# Patient Record
Sex: Male | Born: 1950 | Race: Black or African American | Hispanic: No | Marital: Married | Smoking: Never smoker
Health system: Southern US, Community
[De-identification: ages and names within clinical notes are randomized; demographics above are authoritative.]

## PROBLEM LIST (undated history)

## (undated) DIAGNOSIS — H547 Unspecified visual loss: Secondary | ICD-10-CM

## (undated) DIAGNOSIS — E669 Obesity, unspecified: Secondary | ICD-10-CM

## (undated) DIAGNOSIS — K279 Peptic ulcer, site unspecified, unspecified as acute or chronic, without hemorrhage or perforation: Secondary | ICD-10-CM

## (undated) DIAGNOSIS — E119 Type 2 diabetes mellitus without complications: Secondary | ICD-10-CM

## (undated) DIAGNOSIS — K5900 Constipation, unspecified: Secondary | ICD-10-CM

## (undated) DIAGNOSIS — E7800 Pure hypercholesterolemia, unspecified: Secondary | ICD-10-CM

## (undated) DIAGNOSIS — I10 Essential (primary) hypertension: Secondary | ICD-10-CM

## (undated) HISTORY — PX: EYE SURGERY: SHX253

## (undated) HISTORY — PX: CATARACT EXTRACTION: SUR2

## (undated) HISTORY — PX: NOSE SURGERY: SHX723

## (undated) HISTORY — PX: COLONOSCOPY: SHX174

---

## 2004-08-11 ENCOUNTER — Ambulatory Visit: Payer: Self-pay | Admitting: Unknown Physician Specialty

## 2013-09-18 DIAGNOSIS — I10 Essential (primary) hypertension: Secondary | ICD-10-CM | POA: Diagnosis present

## 2013-09-18 DIAGNOSIS — H547 Unspecified visual loss: Secondary | ICD-10-CM

## 2013-09-18 DIAGNOSIS — E782 Mixed hyperlipidemia: Secondary | ICD-10-CM | POA: Diagnosis present

## 2013-09-18 DIAGNOSIS — E119 Type 2 diabetes mellitus without complications: Secondary | ICD-10-CM

## 2014-01-22 DIAGNOSIS — F840 Autistic disorder: Secondary | ICD-10-CM | POA: Diagnosis present

## 2015-08-18 ENCOUNTER — Encounter: Payer: Self-pay | Admitting: *Deleted

## 2015-08-19 ENCOUNTER — Ambulatory Visit: Payer: Medicare Other | Admitting: Anesthesiology

## 2015-08-19 ENCOUNTER — Encounter: Payer: Self-pay | Admitting: *Deleted

## 2015-08-19 ENCOUNTER — Encounter
Admission: RE | Disposition: A | Discharge status: Home Health | Payer: Self-pay | Source: Ambulatory Visit | Attending: Unknown Physician Specialty

## 2015-08-19 ENCOUNTER — Ambulatory Visit
Admission: RE | Admit: 2015-08-19 | Discharge: 2015-08-19 | Disposition: A | Discharge status: Home Health | Payer: Medicare Other | Source: Ambulatory Visit | Attending: Unknown Physician Specialty | Admitting: Unknown Physician Specialty

## 2015-08-19 DIAGNOSIS — Z1211 Encounter for screening for malignant neoplasm of colon: Secondary | ICD-10-CM | POA: Insufficient documentation

## 2015-08-19 DIAGNOSIS — K640 First degree hemorrhoids: Secondary | ICD-10-CM | POA: Insufficient documentation

## 2015-08-19 DIAGNOSIS — R0602 Shortness of breath: Secondary | ICD-10-CM | POA: Insufficient documentation

## 2015-08-19 DIAGNOSIS — Z8711 Personal history of peptic ulcer disease: Secondary | ICD-10-CM | POA: Diagnosis not present

## 2015-08-19 DIAGNOSIS — E119 Type 2 diabetes mellitus without complications: Secondary | ICD-10-CM | POA: Insufficient documentation

## 2015-08-19 DIAGNOSIS — H540 Blindness, both eyes: Secondary | ICD-10-CM | POA: Insufficient documentation

## 2015-08-19 DIAGNOSIS — I10 Essential (primary) hypertension: Secondary | ICD-10-CM | POA: Diagnosis not present

## 2015-08-19 DIAGNOSIS — Z9842 Cataract extraction status, left eye: Secondary | ICD-10-CM | POA: Insufficient documentation

## 2015-08-19 DIAGNOSIS — Z9841 Cataract extraction status, right eye: Secondary | ICD-10-CM | POA: Insufficient documentation

## 2015-08-19 DIAGNOSIS — Z6828 Body mass index (BMI) 28.0-28.9, adult: Secondary | ICD-10-CM | POA: Diagnosis not present

## 2015-08-19 DIAGNOSIS — E669 Obesity, unspecified: Secondary | ICD-10-CM | POA: Insufficient documentation

## 2015-08-19 DIAGNOSIS — Z79899 Other long term (current) drug therapy: Secondary | ICD-10-CM | POA: Insufficient documentation

## 2015-08-19 DIAGNOSIS — E7800 Pure hypercholesterolemia, unspecified: Secondary | ICD-10-CM | POA: Diagnosis not present

## 2015-08-19 DIAGNOSIS — K5730 Diverticulosis of large intestine without perforation or abscess without bleeding: Secondary | ICD-10-CM | POA: Diagnosis not present

## 2015-08-19 HISTORY — DX: Pure hypercholesterolemia, unspecified: E78.00

## 2015-08-19 HISTORY — DX: Type 2 diabetes mellitus without complications: E11.9

## 2015-08-19 HISTORY — DX: Peptic ulcer, site unspecified, unspecified as acute or chronic, without hemorrhage or perforation: K27.9

## 2015-08-19 HISTORY — PX: COLONOSCOPY WITH PROPOFOL: SHX5780

## 2015-08-19 HISTORY — DX: Obesity, unspecified: E66.9

## 2015-08-19 HISTORY — DX: Unspecified visual loss: H54.7

## 2015-08-19 HISTORY — DX: Constipation, unspecified: K59.00

## 2015-08-19 HISTORY — DX: Essential (primary) hypertension: I10

## 2015-08-19 SURGERY — COLONOSCOPY WITH PROPOFOL
Anesthesia: General

## 2015-08-19 MED ORDER — SODIUM CHLORIDE 0.9 % IV SOLN
INTRAVENOUS | Status: DC
  Administered 2015-08-19: 1000 mL via INTRAVENOUS

## 2015-08-19 MED ORDER — LIDOCAINE HCL (CARDIAC) 20 MG/ML IV SOLN
INTRAVENOUS | Status: DC | PRN
  Administered 2015-08-19: 40 mg via INTRAVENOUS

## 2015-08-19 MED ORDER — PROPOFOL 10 MG/ML IV BOLUS
INTRAVENOUS | Status: DC | PRN
  Administered 2015-08-19: 20 mg via INTRAVENOUS
  Administered 2015-08-19: 30 mg via INTRAVENOUS
  Administered 2015-08-19: 50 mg via INTRAVENOUS

## 2015-08-19 MED ORDER — PROPOFOL 500 MG/50ML IV EMUL
INTRAVENOUS | Status: DC | PRN
Start: 2015-08-19 — End: 2015-08-19
  Administered 2015-08-19: 150 ug/kg/min via INTRAVENOUS

## 2015-08-19 MED ORDER — SODIUM CHLORIDE 0.9 % IV SOLN
INTRAVENOUS | Status: DC
Start: 2015-08-19 — End: 2015-08-19

## 2015-08-19 NOTE — Anesthesia Preprocedure Evaluation (Signed)
Anesthesia Evaluation  Patient identified by MRN, date of birth, ID band Patient awake    Reviewed: Allergy & Precautions, H&P , NPO status , Patient's Chart, lab work & pertinent test results  History of Anesthesia Complications Negative for: history of anesthetic complications  Airway Mallampati: III  TM Distance: >3 FB Neck ROM: limited    Dental  (+) Poor Dentition, Chipped   Pulmonary shortness of breath and with exertion,    Pulmonary exam normal breath sounds clear to auscultation       Cardiovascular Exercise Tolerance: Poor hypertension, (-) angina+ DOE  (-) Past MI Normal cardiovascular exam Rhythm:regular Rate:Normal     Neuro/Psych negative neurological ROS  negative psych ROS   GI/Hepatic Neg liver ROS, PUD, neg GERD  ,  Endo/Other  diabetes, Poorly Controlled, Type 2  Renal/GU negative Renal ROS  negative genitourinary   Musculoskeletal   Abdominal   Peds  Hematology negative hematology ROS (+)   Anesthesia Other Findings Past Medical History:   Blindness                                                    Constipation                                                 Hypertension                                                 Peptic ulcer disease                                         Obesity                                                      Hypercholesteremia                                           Diabetes mellitus without complication (HCC)                Past Surgical History:   NOSE SURGERY                                                  CATARACT EXTRACTION                             Bilateral              EYE SURGERY  COLONOSCOPY                                                  BMI    Body Mass Index   28.49 kg/m 2      Reproductive/Obstetrics negative OB ROS                             Anesthesia  Physical Anesthesia Plan  ASA: III  Anesthesia Plan: General   Post-op Pain Management:    Induction:   Airway Management Planned:   Additional Equipment:   Intra-op Plan:   Post-operative Plan:   Informed Consent: I have reviewed the patients History and Physical, chart, labs and discussed the procedure including the risks, benefits and alternatives for the proposed anesthesia with the patient or authorized representative who has indicated his/her understanding and acceptance.   Dental Advisory Given  Plan Discussed with: Anesthesiologist, CRNA and Surgeon  Anesthesia Plan Comments:         Anesthesia Quick Evaluation

## 2015-08-19 NOTE — Anesthesia Postprocedure Evaluation (Signed)
Anesthesia Post Note  Patient: Keith Obrien  Procedure(s) Performed: Procedure(s) (LRB): COLONOSCOPY WITH PROPOFOL (N/A)  Patient location during evaluation: Endoscopy Anesthesia Type: General Level of consciousness: awake and alert Pain management: pain level controlled Vital Signs Assessment: post-procedure vital signs reviewed and stable Respiratory status: spontaneous breathing, nonlabored ventilation, respiratory function stable and patient connected to nasal cannula oxygen Cardiovascular status: blood pressure returned to baseline and stable Postop Assessment: no signs of nausea or vomiting Anesthetic complications: no    Last Vitals:  Filed Vitals:   08/19/15 1413 08/19/15 1423  BP: 106/88 126/89  Pulse: 74 67  Temp:    Resp: 17 9    Last Pain: There were no vitals filed for this visit.               Cleda MccreedyJoseph K Jermya Dowding

## 2015-08-19 NOTE — Transfer of Care (Signed)
Immediate Anesthesia Transfer of Care Note  Patient: Keith Obrien  Procedure(s) Performed: Procedure(s): COLONOSCOPY WITH PROPOFOL (N/A)  Patient Location: PACU  Anesthesia Type:General  Level of Consciousness: sedated  Airway & Oxygen Therapy: Patient Spontanous Breathing and Patient connected to nasal cannula oxygen  Post-op Assessment: Report given to RN and Post -op Vital signs reviewed and stable  Post vital signs: Reviewed and stable  Last Vitals:  Filed Vitals:   08/19/15 1252  BP: 126/77  Pulse: 70  Temp: 36.8 C  Resp: 20    Last Pain: There were no vitals filed for this visit.       Complications: No apparent anesthesia complications

## 2015-08-19 NOTE — H&P (Signed)
   Primary Care Physician:  Patrice ParadiseMCLAUGHLIN, MIRIAM K, MD Primary Gastroenterologist:  Dr. Mechele CollinElliott  Pre-Procedure History & Physical: HPI:  Keith Obrien is a 65 y.o. male is here for an colonoscopy.   Past Medical History  Diagnosis Date  . Blindness   . Constipation   . Hypertension   . Peptic ulcer disease   . Obesity   . Hypercholesteremia   . Diabetes mellitus without complication [facility](HCC)     Past Surgical History  Procedure Laterality Date  . Nose surgery    . Cataract extraction Bilateral   . Eye surgery    . Colonoscopy      Prior to Admission medications   Medication Sig Start Date End Date Taking? Authorizing Provider  amLODipine (NORVASC) 5 MG tablet Take 5 mg by mouth daily.   Yes Historical Provider, MD  benazepril (LOTENSIN) 10 MG tablet Take 10 mg by mouth daily.   Yes Historical Provider, MD  triamterene-hydrochlorothiazide (MAXZIDE-25) 37.5-25 MG tablet Take 1 tablet by mouth daily.   Yes Historical Provider, MD    Allergies as of 08/03/2015  . (Not on File)    History reviewed. No pertinent family history.  Social History   Social History  . Marital Status: Married    Spouse Name: N/A  . Number of Children: N/A  . Years of Education: N/A   Occupational History  . Not on file.   Social History Main Topics  . Smoking status: Never Smoker   . Smokeless tobacco: Never Used  . Alcohol Use: No  . Drug Use: No  . Sexual Activity: Not on file   Other Topics Concern  . Not on file   Social History Narrative    Review of Systems: See HPI, otherwise negative ROS  Physical Exam: BP 126/89 mmHg  Pulse 67  Temp(Src) 97.3 F (36.3 C) (Axillary)  Resp 9  Ht 5\' 7"  (1.702 m)  Wt 82.555 kg (182 lb)  BMI 28.50 kg/m2  SpO2 100% General:   Alert,  pleasant and cooperative in NAD Head:  Normocephalic and atraumatic. Neck:  Supple; no masses or thyromegaly. Lungs:  Clear throughout to auscultation.    Heart:  Regular rate and rhythm. Abdomen:   Soft, nontender and nondistended. Normal bowel sounds, without guarding, and without rebound.   Neurologic:  Alert and  oriented x4;  grossly normal neurologically.  Impression/Plan: Keith Obrien is here for an colonoscopy to be performed for screening  Risks, benefits, limitations, and alternatives regarding  colonoscopy have been reviewed with the patient.  Questions have been answered.  All parties agreeable.   Lynnae PrudeELLIOTT, ROBERT, MD  08/19/2015, 5:04 PM

## 2015-08-19 NOTE — Anesthesia Procedure Notes (Signed)
Performed by: Debbi Strandberg Pre-anesthesia Checklist: Patient identified, Emergency Drugs available, Patient being monitored, Suction available and Timeout performed Patient Re-evaluated:Patient Re-evaluated prior to inductionOxygen Delivery Method: Nasal cannula Intubation Type: IV induction       

## 2015-08-19 NOTE — Op Note (Signed)
[facility] Gastroenterology Patient Name: Keith Obrien Procedure Date: 08/19/2015 1:16 PM MRN: [id number] Account #: [id number] Date of Birth: 1951/04/03 Admit Type: Outpatient Age: 65 Room: [facility] ENDO ROOM 3 Gender: Male Note Status: Finalized Procedure:            Colonoscopy Indications:          Screening for colorectal malignant neoplasm Providers:            Scot Jun, MD Referring MD:         Marilynne Halsted, MD (Referring MD) Medicines:            Propofol per Anesthesia Complications:        No immediate complications. Procedure:            Pre-Anesthesia Assessment:                       - After reviewing the risks and benefits, the patient                        was deemed in satisfactory condition to undergo the                        procedure.                       After obtaining informed consent, the colonoscope was                        passed under direct vision. Throughout the procedure,                        the patient's blood pressure, pulse, and oxygen                        saturations were monitored continuously. The                        Colonoscope was introduced through the anus and                        advanced to the the cecum, identified by appendiceal                        orifice and ileocecal valve. The colonoscopy was                        somewhat difficult due to significant looping.                        Successful completion of the procedure was aided by                        applying abdominal pressure. The patient tolerated the                        procedure well. The quality of the bowel preparation                        was good. Findings:      Internal hemorrhoids were found during endoscopy. The hemorrhoids were       small  and Grade I (internal hemorrhoids that do not prolapse).      A few small-mouthed diverticula were found in the sigmoid colon.      The exam was otherwise without  abnormality. Impression:           - Internal hemorrhoids.                       - Diverticulosis in the sigmoid colon.                       - The examination was otherwise normal.                       - No specimens collected. Recommendation:       - Repeat colonoscopy in 10 years for screening purposes. Scot Junobert T Husayn Reim, MD 08/19/2015 1:51:53 PM This report has been signed electronically. Number of Addenda: 0 Note Initiated On: 08/19/2015 1:16 PM Scope Withdrawal Time: 0 hours 16 minutes 2 seconds  Total Procedure Duration: 0 hours 28 minutes 46 seconds       [facility]

## 2015-08-22 ENCOUNTER — Encounter: Payer: Self-pay | Admitting: Unknown Physician Specialty

## 2019-12-20 ENCOUNTER — Other Ambulatory Visit
Admission: RE | Admit: 2019-12-20 | Discharge: 2019-12-20 | Disposition: A | Discharge status: Home / Self Care | Payer: Medicare Other | Source: Ambulatory Visit | Attending: Family Medicine | Admitting: Family Medicine

## 2019-12-20 DIAGNOSIS — L03116 Cellulitis of left lower limb: Secondary | ICD-10-CM | POA: Insufficient documentation

## 2019-12-20 DIAGNOSIS — M7989 Other specified soft tissue disorders: Secondary | ICD-10-CM | POA: Diagnosis present

## 2019-12-20 LAB — FIBRIN DERIVATIVES D-DIMER (ARMC ONLY): Fibrin derivatives D-dimer (ARMC): 2930.86 ng/mL (FEU) — ABNORMAL HIGH (ref 0.00–499.00)

## 2019-12-23 ENCOUNTER — Other Ambulatory Visit: Payer: Self-pay

## 2019-12-23 ENCOUNTER — Ambulatory Visit
Admission: RE | Admit: 2019-12-23 | Discharge: 2019-12-23 | Disposition: A | Discharge status: Home / Self Care | Payer: Medicare Other | Source: Ambulatory Visit | Attending: Internal Medicine | Admitting: Internal Medicine

## 2019-12-23 ENCOUNTER — Other Ambulatory Visit: Payer: Self-pay | Admitting: Internal Medicine

## 2019-12-23 ENCOUNTER — Encounter (INDEPENDENT_AMBULATORY_CARE_PROVIDER_SITE_OTHER): Payer: Self-pay

## 2019-12-23 DIAGNOSIS — M79662 Pain in left lower leg: Secondary | ICD-10-CM | POA: Diagnosis present

## 2019-12-23 DIAGNOSIS — M7989 Other specified soft tissue disorders: Secondary | ICD-10-CM | POA: Diagnosis present

## 2021-02-26 IMAGING — US US EXTREM LOW VENOUS*L*
1 series · 13 of 24 positions shown · non-contrast
Comparison: None.

CLINICAL DATA: Left lower leg pain swelling, elevated D-dimer



[Series 1: us extrem low venous*left* · 0.07mm/px · 13 of 32 slices shown]
[im 1/32]
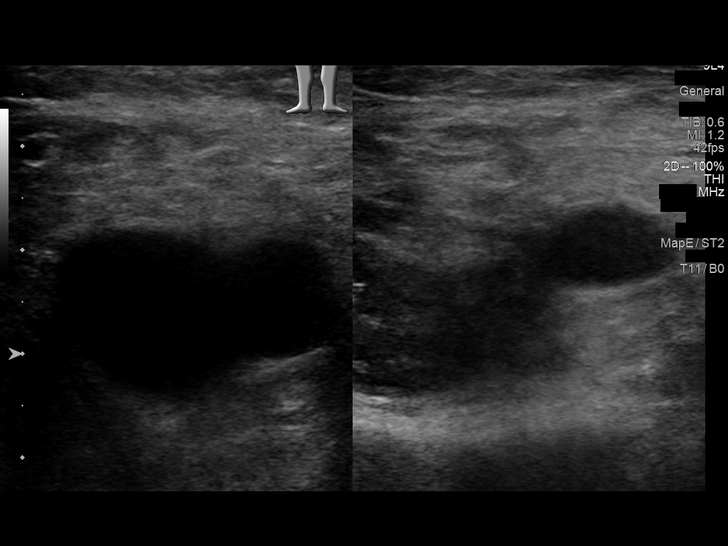
[im 3/32]
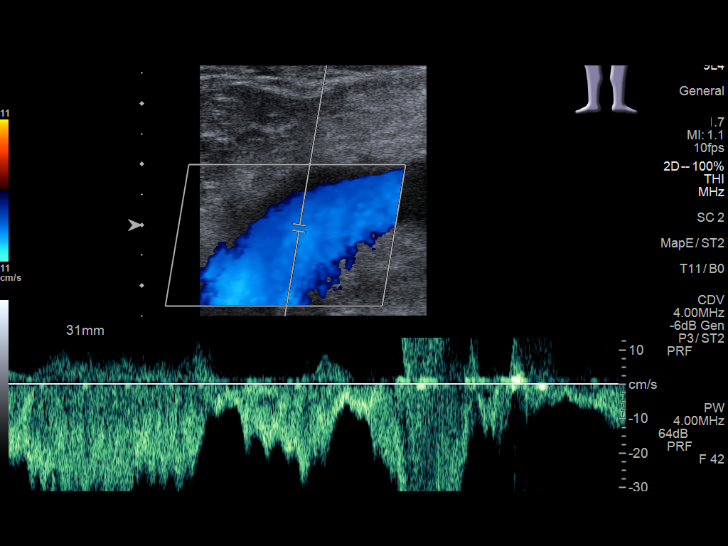
[im 6/32]
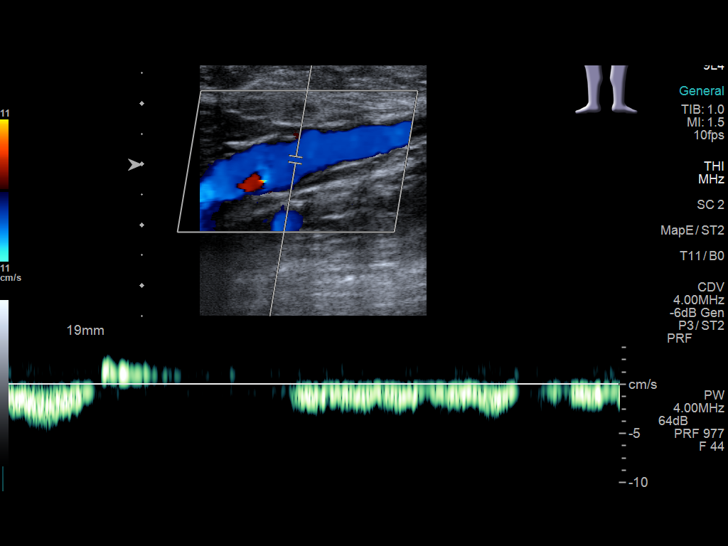
[im 9/32]
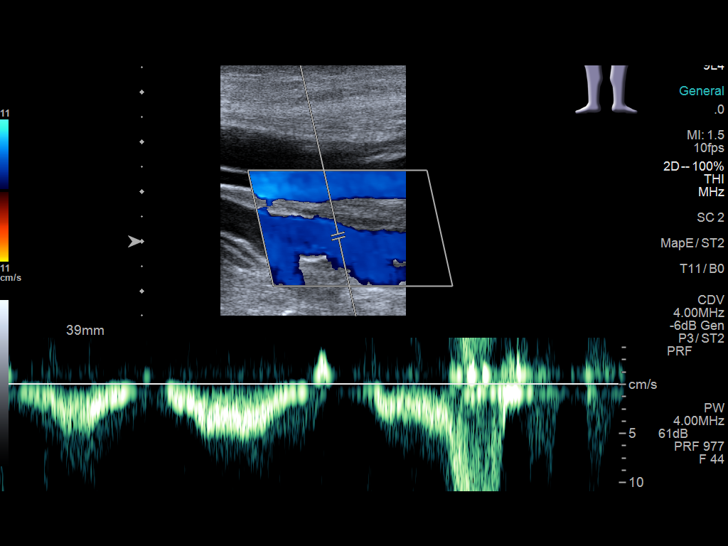
[im 11/32]
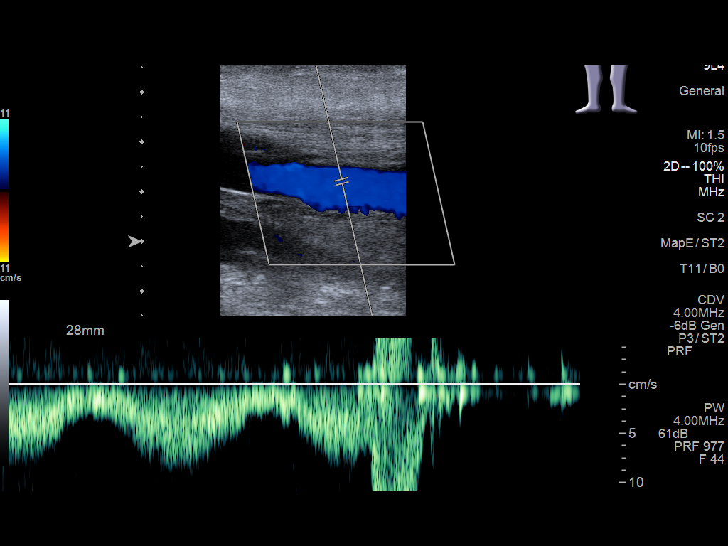
[im 14/32]
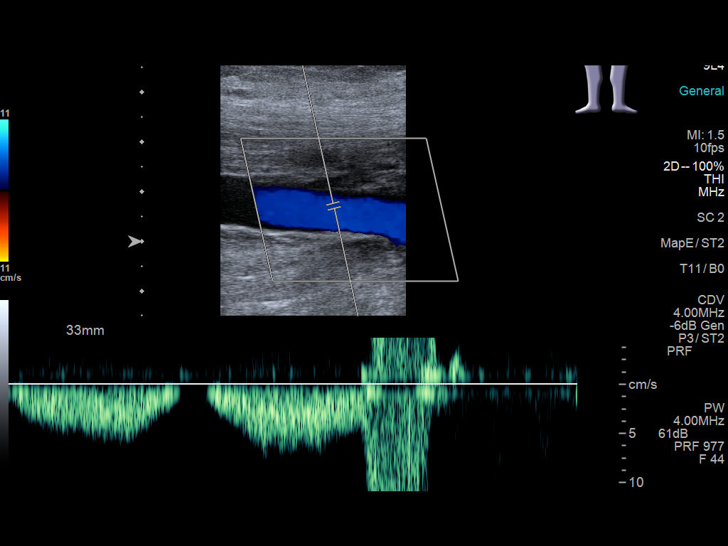
[im 17/32]
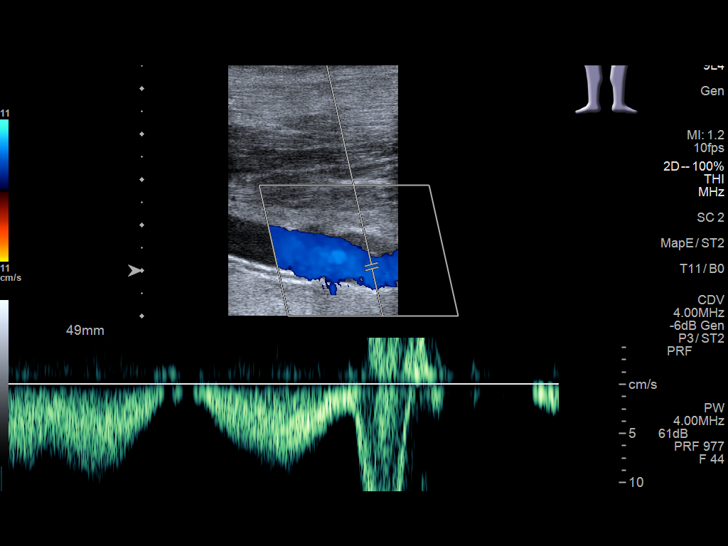
[im 18/32]
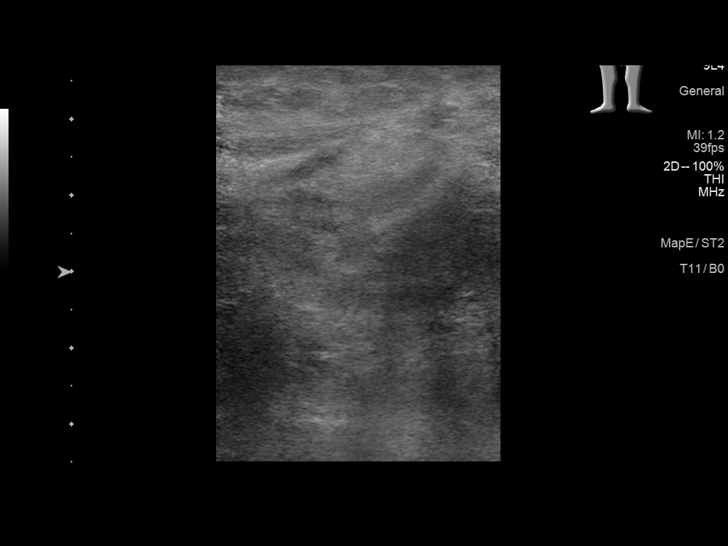
[im 21/32]
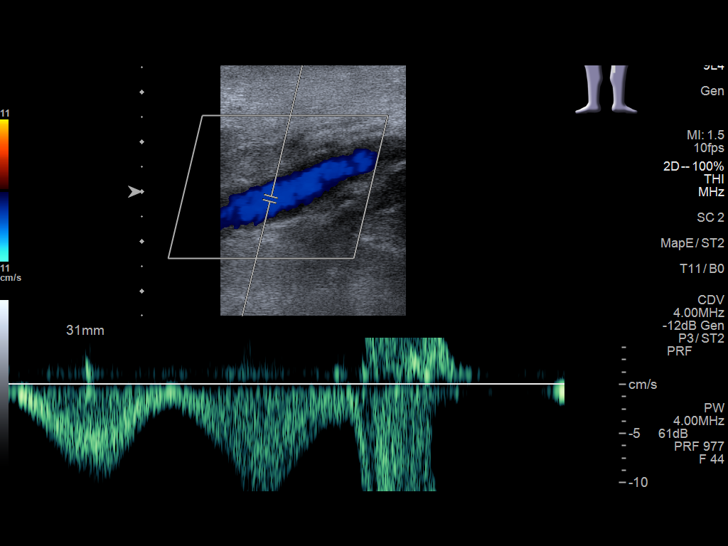
[im 23/32]
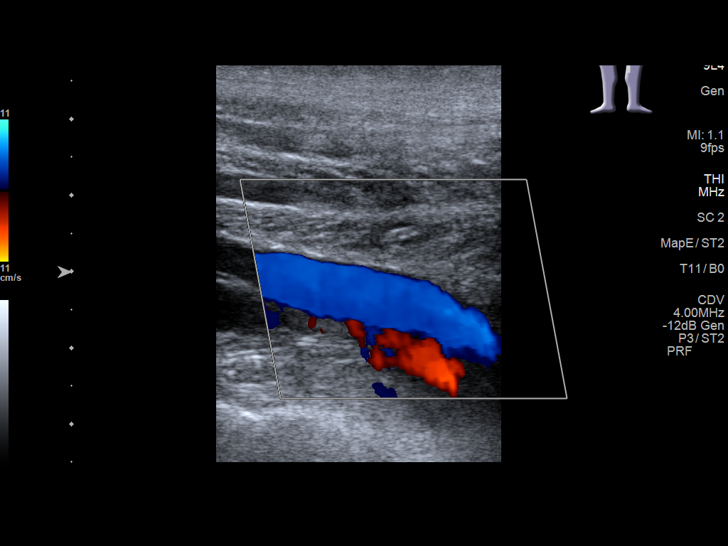
[im 26/32]
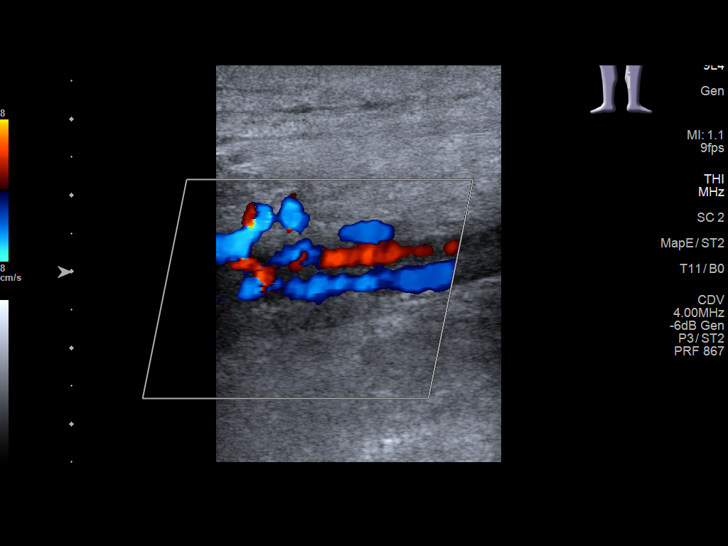
[im 29/32]
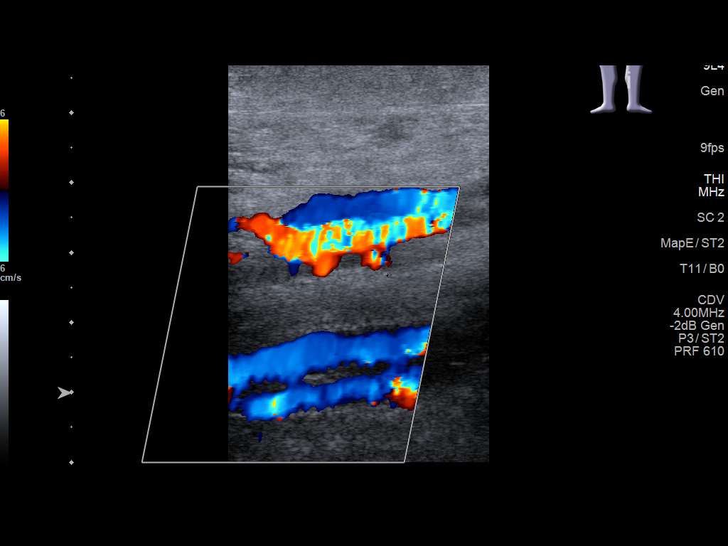
[im 32/32]
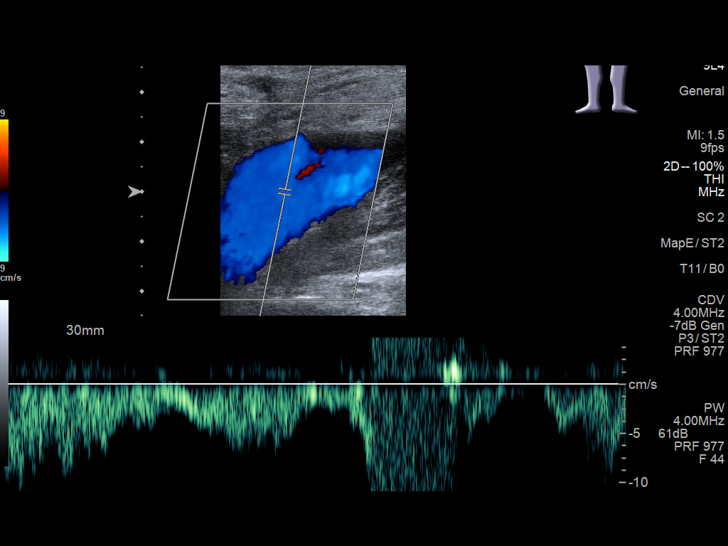

[13 of 24 positions shown; findings below may reference images not displayed]

FINDINGS: Contralateral Common Femoral Vein: Respiratory phasicity is normal
and symmetric with the symptomatic side. No evidence of thrombus.
Normal compressibility.

Common Femoral Vein: No evidence of thrombus. Normal
compressibility, respiratory phasicity and response to augmentation.

Saphenofemoral Junction: No evidence of thrombus. Normal
compressibility and flow on color Doppler imaging.

Profunda Femoral Vein: No evidence of thrombus. Normal
compressibility and flow on color Doppler imaging.

Femoral Vein: No evidence of thrombus. Normal compressibility,
respiratory phasicity and response to augmentation.

Popliteal Vein: No evidence of thrombus. Normal compressibility,
respiratory phasicity and response to augmentation.

Calf Veins: No evidence of thrombus. Normal compressibility and flow
on color Doppler imaging.
IMPRESSION: No evidence of deep venous thrombosis.

## 2022-01-15 ENCOUNTER — Inpatient Hospital Stay
Admission: EM | Admit: 2022-01-15 | Discharge: 2022-01-18 | DRG: 871 | Disposition: A | Discharge status: Skilled Nursing Facility | Payer: Medicare Other | Attending: Internal Medicine | Admitting: Internal Medicine

## 2022-01-15 ENCOUNTER — Other Ambulatory Visit: Payer: Self-pay

## 2022-01-15 ENCOUNTER — Emergency Department: Payer: Medicare Other

## 2022-01-15 DIAGNOSIS — N281 Cyst of kidney, acquired: Secondary | ICD-10-CM | POA: Diagnosis present

## 2022-01-15 DIAGNOSIS — H548 Legal blindness, as defined in USA: Secondary | ICD-10-CM | POA: Diagnosis present

## 2022-01-15 DIAGNOSIS — R7989 Other specified abnormal findings of blood chemistry: Secondary | ICD-10-CM | POA: Diagnosis not present

## 2022-01-15 DIAGNOSIS — L0390 Cellulitis, unspecified: Secondary | ICD-10-CM

## 2022-01-15 DIAGNOSIS — R32 Unspecified urinary incontinence: Secondary | ICD-10-CM | POA: Diagnosis present

## 2022-01-15 DIAGNOSIS — Z9841 Cataract extraction status, right eye: Secondary | ICD-10-CM

## 2022-01-15 DIAGNOSIS — H5440 Blindness, one eye, unspecified eye: Secondary | ICD-10-CM

## 2022-01-15 DIAGNOSIS — B958 Unspecified staphylococcus as the cause of diseases classified elsewhere: Secondary | ICD-10-CM | POA: Diagnosis present

## 2022-01-15 DIAGNOSIS — E119 Type 2 diabetes mellitus without complications: Secondary | ICD-10-CM | POA: Diagnosis not present

## 2022-01-15 DIAGNOSIS — E1165 Type 2 diabetes mellitus with hyperglycemia: Secondary | ICD-10-CM | POA: Diagnosis present

## 2022-01-15 DIAGNOSIS — R410 Disorientation, unspecified: Principal | ICD-10-CM

## 2022-01-15 DIAGNOSIS — G9341 Metabolic encephalopathy: Secondary | ICD-10-CM | POA: Diagnosis present

## 2022-01-15 DIAGNOSIS — L03116 Cellulitis of left lower limb: Secondary | ICD-10-CM | POA: Diagnosis present

## 2022-01-15 DIAGNOSIS — J9811 Atelectasis: Secondary | ICD-10-CM | POA: Diagnosis present

## 2022-01-15 DIAGNOSIS — N200 Calculus of kidney: Secondary | ICD-10-CM | POA: Diagnosis present

## 2022-01-15 DIAGNOSIS — E782 Mixed hyperlipidemia: Secondary | ICD-10-CM | POA: Diagnosis present

## 2022-01-15 DIAGNOSIS — Z7984 Long term (current) use of oral hypoglycemic drugs: Secondary | ICD-10-CM | POA: Diagnosis not present

## 2022-01-15 DIAGNOSIS — Z79899 Other long term (current) drug therapy: Secondary | ICD-10-CM

## 2022-01-15 DIAGNOSIS — K449 Diaphragmatic hernia without obstruction or gangrene: Secondary | ICD-10-CM | POA: Diagnosis present

## 2022-01-15 DIAGNOSIS — I10 Essential (primary) hypertension: Secondary | ICD-10-CM

## 2022-01-15 DIAGNOSIS — Z9842 Cataract extraction status, left eye: Secondary | ICD-10-CM

## 2022-01-15 DIAGNOSIS — H547 Unspecified visual loss: Secondary | ICD-10-CM

## 2022-01-15 DIAGNOSIS — N179 Acute kidney failure, unspecified: Secondary | ICD-10-CM | POA: Diagnosis present

## 2022-01-15 DIAGNOSIS — A419 Sepsis, unspecified organism: Principal | ICD-10-CM

## 2022-01-15 DIAGNOSIS — Z20822 Contact with and (suspected) exposure to covid-19: Secondary | ICD-10-CM | POA: Diagnosis present

## 2022-01-15 DIAGNOSIS — R6520 Severe sepsis without septic shock: Secondary | ICD-10-CM | POA: Diagnosis present

## 2022-01-15 DIAGNOSIS — D72829 Elevated white blood cell count, unspecified: Secondary | ICD-10-CM

## 2022-01-15 DIAGNOSIS — F840 Autistic disorder: Secondary | ICD-10-CM | POA: Diagnosis present

## 2022-01-15 LAB — COMPREHENSIVE METABOLIC PANEL
ALT: 20 U/L (ref 0–44)
AST: 35 U/L (ref 15–41)
Albumin: 4.4 g/dL (ref 3.5–5.0)
Alkaline Phosphatase: 75 U/L (ref 38–126)
Anion gap: 13 (ref 5–15)
BUN: 26 mg/dL — ABNORMAL HIGH (ref 8–23)
CO2: 22 mmol/L (ref 22–32)
Calcium: 9.6 mg/dL (ref 8.9–10.3)
Chloride: 104 mmol/L (ref 98–111)
Creatinine, Ser: 1.78 mg/dL — ABNORMAL HIGH (ref 0.61–1.24)
GFR, Estimated: 40 mL/min — ABNORMAL LOW (ref >60)
Glucose, Bld: 166 mg/dL — ABNORMAL HIGH (ref 70–99)
Potassium: 3.7 mmol/L (ref 3.5–5.1)
Sodium: 139 mmol/L (ref 135–145)
Total Bilirubin: 0.8 mg/dL (ref 0.3–1.2)
Total Protein: 8.2 g/dL — ABNORMAL HIGH (ref 6.5–8.1)

## 2022-01-15 LAB — SARS CORONAVIRUS 2 BY RT PCR: SARS Coronavirus 2 by RT PCR: NEGATIVE

## 2022-01-15 LAB — CBC WITH DIFFERENTIAL/PLATELET
Abs Immature Granulocytes: 0.1 10*3/uL — ABNORMAL HIGH (ref 0.00–0.07)
Basophils Absolute: 0 10*3/uL (ref 0.0–0.1)
Basophils Relative: 0 %
Eosinophils Absolute: 0 10*3/uL (ref 0.0–0.5)
Eosinophils Relative: 0 %
HCT: 45.3 % (ref 39.0–52.0)
Hemoglobin: 14 g/dL (ref 13.0–17.0)
Immature Granulocytes: 1 %
Lymphocytes Relative: 6 %
Lymphs Abs: 0.9 10*3/uL (ref 0.7–4.0)
MCH: 27.6 pg (ref 26.0–34.0)
MCHC: 30.9 g/dL (ref 30.0–36.0)
MCV: 89.3 fL (ref 80.0–100.0)
Monocytes Absolute: 0.6 10*3/uL (ref 0.1–1.0)
Monocytes Relative: 4 %
Neutro Abs: 14.4 10*3/uL — ABNORMAL HIGH (ref 1.7–7.7)
Neutrophils Relative %: 89 %
Platelets: 209 10*3/uL (ref 150–400)
RBC: 5.07 MIL/uL (ref 4.22–5.81)
RDW: 14.8 % (ref 11.5–15.5)
WBC: 16 10*3/uL — ABNORMAL HIGH (ref 4.0–10.5)
nRBC: 0 % (ref 0.0–0.2)

## 2022-01-15 LAB — URINALYSIS, ROUTINE W REFLEX MICROSCOPIC
Bilirubin Urine: NEGATIVE
Glucose, UA: >=500 mg/dL — AB
Hgb urine dipstick: NEGATIVE
Ketones, ur: NEGATIVE mg/dL
Leukocytes,Ua: NEGATIVE
Nitrite: NEGATIVE
Protein, ur: NEGATIVE mg/dL
Specific Gravity, Urine: 1.021 (ref 1.005–1.030)
pH: 5 (ref 5.0–8.0)

## 2022-01-15 LAB — PROTIME-INR
INR: 1.2 (ref 0.8–1.2)
Prothrombin Time: 14.7 seconds (ref 11.4–15.2)

## 2022-01-15 LAB — LACTIC ACID, PLASMA
Lactic Acid, Venous: 3.8 mmol/L (ref 0.5–1.9)
Lactic Acid, Venous: 1.7 mmol/L (ref 0.5–1.9)
Lactic Acid, Venous: 1.2 mmol/L (ref 0.5–1.9)

## 2022-01-15 MED ORDER — VANCOMYCIN HCL IN DEXTROSE 1-5 GM/200ML-% IV SOLN
1000.0000 mg | INTRAVENOUS | Status: DC
  Administered 2022-01-16: 1000 mg via INTRAVENOUS
  Filled 2022-01-15: qty 200

## 2022-01-15 MED ORDER — SODIUM CHLORIDE 0.9 % IV SOLN
2.0000 g | Freq: Two times a day (BID) | INTRAVENOUS | Status: DC
  Administered 2022-01-16: 2 g via INTRAVENOUS
  Filled 2022-01-15: qty 12.5

## 2022-01-15 MED ORDER — IOHEXOL 300 MG/ML  SOLN
80.0000 mL | Freq: Once | INTRAMUSCULAR | Status: AC | PRN
  Administered 2022-01-15: 80 mL via INTRAVENOUS

## 2022-01-15 MED ORDER — SODIUM CHLORIDE 0.9 % IV SOLN
2.0000 g | Freq: Once | INTRAVENOUS | Status: AC
  Administered 2022-01-15: 2 g via INTRAVENOUS
  Filled 2022-01-15: qty 12.5

## 2022-01-15 MED ORDER — INSULIN ASPART 100 UNIT/ML IJ SOLN
0.0000 [IU] | Freq: Three times a day (TID) | INTRAMUSCULAR | Status: DC
  Administered 2022-01-16 – 2022-01-17 (×4): 2 [IU] via SUBCUTANEOUS
  Administered 2022-01-17: 3 [IU] via SUBCUTANEOUS
  Administered 2022-01-17: 2 [IU] via SUBCUTANEOUS
  Administered 2022-01-18: 3 [IU] via SUBCUTANEOUS
  Filled 2022-01-15 (×7): qty 1

## 2022-01-15 MED ORDER — VANCOMYCIN HCL IN DEXTROSE 1-5 GM/200ML-% IV SOLN
1000.0000 mg | Freq: Once | INTRAVENOUS | Status: AC
  Administered 2022-01-15: 1000 mg via INTRAVENOUS
  Filled 2022-01-15: qty 200

## 2022-01-15 MED ORDER — ACETAMINOPHEN 325 MG PO TABS
650.0000 mg | ORAL_TABLET | Freq: Four times a day (QID) | ORAL | Status: DC | PRN
  Administered 2022-01-15: 650 mg via ORAL
  Filled 2022-01-15: qty 2

## 2022-01-15 MED ORDER — LACTATED RINGERS IV BOLUS
1000.0000 mL | Freq: Once | INTRAVENOUS | Status: AC
  Administered 2022-01-15: 1000 mL via INTRAVENOUS

## 2022-01-15 MED ORDER — INSULIN ASPART 100 UNIT/ML IJ SOLN
0.0000 [IU] | Freq: Every day | INTRAMUSCULAR | Status: DC
  Filled 2022-01-15: qty 1

## 2022-01-15 NOTE — Progress Notes (Signed)
Pharmacy Antibiotic Note  Keith Obrien is a 71 y.o. male admitted on 01/15/2022 with unknown source.  Pharmacy has been consulted for Cefepime & Vancomycin dosing x 7 days.  Plan: Cefepime 2 gm q12hr per indication & renal fxn.   Pt given Vancomycin 1000 mg once. Vancomycin 1000 mg IV Q 24 hrs. Goal AUC 400-550. Expected AUC: 441.5 SCr used: 1.78 Pharmacy will continue to follow and will adjust abx dosing whenever warranted.  Temp (24hrs), Avg:99.5 F (37.5 C), Min:98.2 F (36.8 C), Max:101.7 F (38.7 C)   Recent Labs  Lab 01/15/22 1816 01/15/22 1821 01/15/22 2021 01/15/22 2241  WBC 16.0*  --   --   --   CREATININE 1.78*  --   --   --   LATICACIDVEN  --  3.8* 1.7 1.2    Estimated Creatinine Clearance: 40.6 mL/min (A) (by C-G formula based on SCr of 1.78 mg/dL (H)).    No Known Allergies  Antimicrobials this admission: 10/7 Cefepime >> x 7 days 10/7 Vancomycin >> x 7 days  Microbiology results: 10/7 BCx: Pending  Thank you for allowing pharmacy to be a part of this patient's care.  Renda Rolls, PharmD, MBA 01/15/2022 11:15 PM

## 2022-01-15 NOTE — ED Notes (Signed)
Lab at bedside drawing 2nd set of [facility]

## 2022-01-15 NOTE — ED Provider Notes (Signed)
[facility] Provider Note    Event Date/Time   First MD Initiated Contact with Patient 01/15/22 1845     (approximate)   History   AMS  HPI  Keith Obrien is a 71 y.o. male  who presents from living facility today because of concern for confusion, high blood sugar and urinary incontinence. Patient is unable to give significant history but does state that he urinated on himself today. Did say he had some discomfort to his left abdomen. The patient was found to have fever and high blood sugar at the facility.       Physical Exam   Triage Vital Signs: ED Triage Vitals  Enc Vitals Group     BP 01/15/22 1811 (!) 152/88     Pulse Rate 01/15/22 1811 (!) 115     Resp 01/15/22 1811 (!) 28     Temp 01/15/22 1811 98.5 F (36.9 C)     Temp Source 01/15/22 1811 Oral     SpO2 01/15/22 1811 100 %     Weight 01/15/22 1812 209 lb 10.5 oz (95.1 kg)     Height --      Head Circumference --      Peak Flow --      Pain Score 01/15/22 1812 0     Pain Loc --      Pain Edu? --      Excl. in [place]? --     Most recent vital signs: Vitals:   01/15/22 1811  BP: (!) 152/88  Pulse: (!) 115  Resp: (!) 28  Temp: 98.5 F (36.9 C)  SpO2: 100%   General: Awake, alert, not completely oriented. CV:  Good peripheral perfusion. Tachycardia Resp:  Normal effort. Lungs clear. Abd:  No distention. Non tender. Other:  Area of redness and warmth to the left lower leg.   ED Results / Procedures / Treatments   Labs (all labs ordered are listed, but only abnormal results are displayed) Labs Reviewed  COMPREHENSIVE METABOLIC PANEL - Abnormal; Notable for the following components:      Result Value   Glucose, Bld 166 (*)    BUN 26 (*)    Creatinine, Ser 1.78 (*)    Total Protein 8.2 (*)    GFR, Estimated 40 (*)    All other components within normal limits  LACTIC ACID, PLASMA - Abnormal; Notable for the following components:   Lactic Acid, Venous 3.8 (*)    All other  components within normal limits  CBC WITH DIFFERENTIAL/PLATELET - Abnormal; Notable for the following components:   WBC 16.0 (*)    Neutro Abs 14.4 (*)    Abs Immature Granulocytes 0.10 (*)    All other components within normal limits  URINALYSIS, ROUTINE W REFLEX MICROSCOPIC - Abnormal; Notable for the following components:   Color, Urine YELLOW (*)    APPearance HAZY (*)    Glucose, UA >=500 (*)    Bacteria, UA RARE (*)    All other components within normal limits  CULTURE, BLOOD (ROUTINE X 2)  CULTURE, BLOOD (ROUTINE X 2)  SARS CORONAVIRUS 2 BY RT PCR  LACTIC ACID, PLASMA  PROTIME-INR     EKG  I, Nance Pear, attending physician, personally viewed and interpreted this EKG  EKG Time: 1810 Rate: 119 Rhythm: sinus tachycardia Axis: normal Intervals: qtc 639 QRS: narrow, q waves v1, v2, v3 ST changes: no st elevation Impression: abnormal ekg   RADIOLOGY I independently interpreted and visualized the CXR.  My interpretation: No pneumonia Radiology interpretation:  IMPRESSION:  Linear densities in right lower lung fields may suggest subsegmental  atelectasis. There are no signs of pulmonary edema or focal  pulmonary consolidation.    I independently interpreted and visualized the ct abd/pel. My interpretation: No free air Radiology interpretation:  IMPRESSION:  1. Moderate to large hiatal hernia.  2. Multiple bilateral simple renal cysts. No follow-up imaging is  recommended. This recommendation follows ACR consensus guidelines:  Management of the Incidental Renal Mass on CT: A White Paper of the  ACR Incidental Findings Committee. J Am Coll Radiol 2018;15:264-273.  3. 3 mm nonobstructing right renal calculus.  4. Enlarged and heterogeneous prostate gland. Correlation with PSA  values is recommended.  5. Marked severity multilevel degenerative changes throughout the  lumbar spine.           PROCEDURES:  Critical Care performed:  No  Procedures   MEDICATIONS ORDERED IN ED: Medications - No data to display   IMPRESSION / MDM / ASSESSMENT AND PLAN / ED COURSE  I reviewed the triage vital signs and the nursing notes.                              Differential diagnosis includes, but is not limited to, infection, DKA, anemia.  Patient's presentation is most consistent with acute presentation with potential threat to life or bodily function.  Patient presented to the emergency today because of concerns for confusion, high blood sugar and fevers.  Patient was afebrile here.  Work-up however is concerning for elevated white count and initially elevated lactic acid level.  On physical exam he does have area of possible cellulitis of his left lower extremity.  Broad work-up was initiated for source of other infection.  This included chest x-ray as well as CT abdomen pelvis.  Neither showed obvious infectious source.  UA not convincing for infection.  Patient was given broad-spectrum antibiotics and IV fluids.  Repeat lactic acid had improved. Discussed with Dr. Mikeal Hawthorne with the hospitalist service who will plan on admission.  FINAL CLINICAL IMPRESSION(S) / ED DIAGNOSES   Final diagnoses:  Confusion  Leukocytosis, unspecified type  Elevated lactic acid level  Cellulitis, unspecified cellulitis site     Note:  This document was prepared using Dragon voice recognition software and may include unintentional dictation errors.    Phineas Semen, MD 01/15/22 2200

## 2022-01-15 NOTE — ED Triage Notes (Addendum)
Pt. To ED via EMS from [facility] for AMS and confusion with fever, and hyperglycemia. Per EMS, pt's temp 100.7 en route. Pt. Is legally blind and high functioning autistic.pt. is conversational in triage, and alert and oriented x3. Pt. Reports he has had urinary incontinence today, unsure if this is his norm. Pt. Has area of redness and warm to lower left leg.

## 2022-01-15 NOTE — H&P (Signed)
History and Physical    Patient: Keith Obrien ZWC:[id number] DOB: Dec 14, 1950 DOA: 01/15/2022 DOS: the patient was seen and examined on 01/15/2022 PCP: Marinda Elk, MD  Patient coming from: Home  Chief Complaint:  Chief Complaint  Patient presents with   Altered Mental Status    Pt. To ED via EMS from [facility] for AMS and confusion with fever, and hyperglycemia. Per EMS, Pt. Is legally blind and high functioning autistic.pt. is conversational in triage, and alert and oriented.   HPI: Keith Obrien is a 71 y.o. male with medical history significant of complete blindness, diabetes, constipation, hyperlipidemia, essential hypertension who was brought in from [facility] independent living facility with altered mental status, fever and hyperglycemia.  Patient's initial temperature was apparently 100.7 by EMS but more than 101.2 rectally here.  He is autistic but legally blind.  Patient apparently had urinary incontinence all day.  He arrived the ER where he was noted to meet criteria for sepsis.  Source was unclear.  There is a small area of redness and warmth in the left lower leg but not sure if that is the source.  His urinalysis is nonrevealing.  His lactic acid level was 3.8.  Also leukocytosis of 16 as well as AKI with creatinine 1.78.  As a result patient is being admitted with a diagnosis of sepsis of unknown source with altered mental status.  Review of Systems: As mentioned in the history of present illness. All other systems reviewed and are negative. Past Medical History:  Diagnosis Date   Blindness    Constipation    Diabetes mellitus without complication ([place])    Hypercholesteremia    Hypertension    Obesity    Peptic ulcer disease    Past Surgical History:  Procedure Laterality Date   CATARACT EXTRACTION Bilateral    COLONOSCOPY     COLONOSCOPY WITH PROPOFOL N/A 08/19/2015   Procedure: COLONOSCOPY WITH PROPOFOL;  Surgeon: Manya Silvas, MD;   Location: [facility] ENDOSCOPY;  Service: Endoscopy;  Laterality: N/A;   EYE SURGERY     NOSE SURGERY     Social History:  reports that he has never smoked. He has never used smokeless tobacco. He reports that he does not drink alcohol and does not use drugs.  No Known Allergies  No family history on file.  Prior to Admission medications   Medication Sig Start Date End Date Taking? Authorizing Provider  amLODipine (NORVASC) 5 MG tablet Take 5 mg by mouth daily.   Yes [provider]  benazepril (LOTENSIN) 10 MG tablet Take 10 mg by mouth daily.   Yes [provider]  furosemide (LASIX) 20 MG tablet Take 20 mg by mouth daily. 11/22/21  Yes [provider]  metFORMIN (GLUCOPHAGE) 1000 MG tablet Take 1,000 mg by mouth daily. 11/06/21  Yes [provider]  omeprazole (PRILOSEC) 40 MG capsule Take 40 mg by mouth daily. 11/06/21  Yes [provider]  potassium chloride (KLOR-CON) 10 MEQ tablet Take 10 mEq by mouth daily. 11/18/21  Yes [provider]  risperiDONE (RISPERDAL) 0.5 MG tablet Take 0.5 mg by mouth at bedtime. 12/21/21  Yes [provider]  triamterene-hydrochlorothiazide (MAXZIDE-25) 37.5-25 MG tablet Take 1 tablet by mouth daily.   Yes [provider]  silver sulfADIAZINE (SILVADENE) 1 % cream Apply 1 Application topically daily as needed. 10/21/21   [provider]    Physical Exam: Vitals:   01/15/22 2025 01/15/22 2030 01/15/22 2235 01/15/22 2248  BP:   128/82   Pulse:  (!) 115 (!) 108   Resp:  (!) 32 (!) 25   Temp: 98.2 F (36.8 C)   (!) 101.7 F (38.7 C)  TempSrc: Oral   Rectal  SpO2:  96% 96%   Weight:       Constitutional: Acutely ill looking, confused NAD, calm, comfortable Eyes: PERRL, lids and conjunctivae normal.  Legally blind ENMT: Mucous membranes are moist. Posterior pharynx clear of any exudate or lesions.Normal dentition.  Neck: normal, supple, no masses, no thyromegaly Respiratory:  clear to auscultation bilaterally, no wheezing, no crackles. Normal respiratory effort. No accessory muscle use.  Cardiovascular: Sinus tachycardia, no murmurs / rubs / gallops. No extremity edema. 2+ pedal pulses. No carotid bruits.  Abdomen: no tenderness, no masses palpated. No hepatosplenomegaly. Bowel sounds positive.  Musculoskeletal: Good range of motion, no joint swelling or tenderness, Skin: no rashes, lesions, ulcers. No induration Neurologic: CN 2-12 grossly intact. Sensation intact, DTR normal. Strength 5/5 in all 4.  Psychiatric: Conversational but confused   data Reviewed:  BUN is 26 creatinine 1.78.  Glucose 166.  Lactic acid 3.8, white count 16 and normal CBC.  Analysis nonrevealing.  CT abdomen pelvis showed moderate large hiatal hernia with multiple bilateral simple renal cysts.  Assessment and Plan:  #1 sepsis syndrome: Patient meets sepsis criteria with temperature of 101.5 and sinus tachycardia.  Lactic acid is 3.8.  This could be coming from his left leg early cellulitis or other unknown causes.  He did have some abdominal pain but CT is not revealing.  We will admit the patient for sepsis work-up.  Initiate antibiotics for sepsis from unknown cause.  #2 diabetes: Initiate sliding scale insulin.  Continue monitoring blood sugar.  #3 essential hypertension: Continue home regimen once confirmed.  #4 mixed hyperlipidemia: Continue statin  #5 autism disorder: Patient is highly functional.  Continue to monitor  #6 legal blindness: We will take this into account in patient's management.    Advance Care Planning:   Code Status: Not on file full code  Consults: None  Family Communication: No family at bedside  Severity of Illness: The appropriate patient status for this patient is INPATIENT. Inpatient status is judged to be reasonable and necessary in order to provide the required intensity of service to ensure the patient's safety. The patient's presenting symptoms,  physical exam findings, and initial radiographic and laboratory data in the context of their chronic comorbidities is felt to place them at high risk for further clinical deterioration. Furthermore, it is not anticipated that the patient will be medically stable for discharge from the hospital within 2 midnights of admission.   * I certify that at the point of admission it is my clinical judgment that the patient will require inpatient hospital care spanning beyond 2 midnights from the point of admission due to high intensity of service, high risk for further deterioration and high frequency of surveillance required.*  AuthorLonia Blood, MD 01/15/2022 11:10 PM  For on call review [email or web address].

## 2022-01-16 ENCOUNTER — Inpatient Hospital Stay: Payer: Medicare Other

## 2022-01-16 DIAGNOSIS — A419 Sepsis, unspecified organism: Secondary | ICD-10-CM | POA: Diagnosis not present

## 2022-01-16 DIAGNOSIS — N179 Acute kidney failure, unspecified: Secondary | ICD-10-CM

## 2022-01-16 DIAGNOSIS — I10 Essential (primary) hypertension: Secondary | ICD-10-CM | POA: Diagnosis not present

## 2022-01-16 DIAGNOSIS — L0390 Cellulitis, unspecified: Secondary | ICD-10-CM | POA: Insufficient documentation

## 2022-01-16 DIAGNOSIS — F840 Autistic disorder: Secondary | ICD-10-CM | POA: Diagnosis not present

## 2022-01-16 DIAGNOSIS — R7989 Other specified abnormal findings of blood chemistry: Secondary | ICD-10-CM

## 2022-01-16 LAB — BLOOD CULTURE ID PANEL (REFLEXED) - BCID2

## 2022-01-16 LAB — CBC
HCT: 40 % (ref 39.0–52.0)
Hemoglobin: 12.8 g/dL — ABNORMAL LOW (ref 13.0–17.0)
MCH: 28.1 pg (ref 26.0–34.0)
MCHC: 32 g/dL (ref 30.0–36.0)
MCV: 87.7 fL (ref 80.0–100.0)
Platelets: 169 10*3/uL (ref 150–400)
RBC: 4.56 MIL/uL (ref 4.22–5.81)
RDW: 14.7 % (ref 11.5–15.5)
WBC: 11.1 10*3/uL — ABNORMAL HIGH (ref 4.0–10.5)
nRBC: 0 % (ref 0.0–0.2)

## 2022-01-16 LAB — CBG MONITORING, ED
Glucose-Capillary: 133 mg/dL — ABNORMAL HIGH (ref 70–99)
Glucose-Capillary: 136 mg/dL — ABNORMAL HIGH (ref 70–99)
Glucose-Capillary: 138 mg/dL — ABNORMAL HIGH (ref 70–99)

## 2022-01-16 LAB — CREATININE, SERUM
Creatinine, Ser: 1.43 mg/dL — ABNORMAL HIGH (ref 0.61–1.24)
GFR, Estimated: 52 mL/min — ABNORMAL LOW (ref >60)

## 2022-01-16 LAB — HEMOGLOBIN A1C
Hgb A1c MFr Bld: 8.3 % — ABNORMAL HIGH (ref 4.8–5.6)
Mean Plasma Glucose: 191.51 mg/dL

## 2022-01-16 LAB — PROCALCITONIN: Procalcitonin: 2.06 ng/mL

## 2022-01-16 LAB — GLUCOSE, CAPILLARY
Glucose-Capillary: 128 mg/dL — ABNORMAL HIGH (ref 70–99)
Glucose-Capillary: 139 mg/dL — ABNORMAL HIGH (ref 70–99)

## 2022-01-16 LAB — CORTISOL-AM, BLOOD: Cortisol - AM: 12.7 ug/dL (ref 6.7–22.6)

## 2022-01-16 MED ORDER — FUROSEMIDE 40 MG PO TABS
20.0000 mg | ORAL_TABLET | Freq: Every day | ORAL | Status: DC
  Administered 2022-01-16: 20 mg via ORAL
  Filled 2022-01-16: qty 1

## 2022-01-16 MED ORDER — POTASSIUM CHLORIDE CRYS ER 10 MEQ PO TBCR
10.0000 meq | EXTENDED_RELEASE_TABLET | Freq: Every day | ORAL | Status: DC
  Administered 2022-01-16 – 2022-01-18 (×3): 10 meq via ORAL
  Filled 2022-01-16 (×3): qty 1

## 2022-01-16 MED ORDER — VANCOMYCIN HCL IN DEXTROSE 1-5 GM/200ML-% IV SOLN: 1000.0000 mg | Freq: Once | INTRAVENOUS | Status: DC

## 2022-01-16 MED ORDER — TRIAMTERENE-HCTZ 37.5-25 MG PO TABS
1.0000 | ORAL_TABLET | Freq: Every day | ORAL | Status: DC
  Administered 2022-01-16: 1 via ORAL
  Filled 2022-01-16: qty 1

## 2022-01-16 MED ORDER — ENOXAPARIN SODIUM 60 MG/0.6ML IJ SOSY
0.5000 mg/kg | PREFILLED_SYRINGE | INTRAMUSCULAR | Status: DC
  Administered 2022-01-16 – 2022-01-18 (×3): 47.5 mg via SUBCUTANEOUS
  Filled 2022-01-16 (×3): qty 0.6

## 2022-01-16 MED ORDER — SODIUM CHLORIDE 0.9 % IV SOLN: 2.0000 g | Freq: Once | INTRAVENOUS | Status: DC

## 2022-01-16 MED ORDER — AMLODIPINE BESYLATE 5 MG PO TABS
5.0000 mg | ORAL_TABLET | Freq: Every day | ORAL | Status: DC
  Administered 2022-01-16 – 2022-01-18 (×3): 5 mg via ORAL
  Filled 2022-01-16 (×3): qty 1

## 2022-01-16 MED ORDER — RISPERIDONE 0.5 MG PO TABS
0.5000 mg | ORAL_TABLET | Freq: Every day | ORAL | Status: DC
  Administered 2022-01-16 – 2022-01-17 (×2): 0.5 mg via ORAL
  Filled 2022-01-16 (×2): qty 1

## 2022-01-16 MED ORDER — METRONIDAZOLE 500 MG/100ML IV SOLN
500.0000 mg | Freq: Two times a day (BID) | INTRAVENOUS | Status: DC
  Administered 2022-01-16: 500 mg via INTRAVENOUS
  Filled 2022-01-16: qty 100

## 2022-01-16 MED ORDER — LACTATED RINGERS IV SOLN: INTRAVENOUS | Status: DC

## 2022-01-16 MED ORDER — SODIUM CHLORIDE 0.9 % IV SOLN
2.0000 g | INTRAVENOUS | Status: DC
  Administered 2022-01-17 – 2022-01-18 (×2): 2 g via INTRAVENOUS
  Filled 2022-01-16 (×2): qty 2

## 2022-01-16 MED ORDER — ONDANSETRON HCL 4 MG/2ML IJ SOLN: 4.0000 mg | Freq: Four times a day (QID) | INTRAMUSCULAR | Status: DC | PRN

## 2022-01-16 MED ORDER — SILVER SULFADIAZINE 1 % EX CREA: 1.0000 | TOPICAL_CREAM | Freq: Every day | CUTANEOUS | Status: DC | PRN

## 2022-01-16 MED ORDER — ONDANSETRON HCL 4 MG PO TABS: 4.0000 mg | ORAL_TABLET | Freq: Four times a day (QID) | ORAL | Status: DC | PRN

## 2022-01-16 MED ORDER — PANTOPRAZOLE SODIUM 40 MG PO TBEC
40.0000 mg | DELAYED_RELEASE_TABLET | Freq: Every day | ORAL | Status: DC
  Administered 2022-01-16 – 2022-01-18 (×3): 40 mg via ORAL
  Filled 2022-01-16 (×3): qty 1

## 2022-01-16 MED ORDER — BENAZEPRIL HCL 10 MG PO TABS
10.0000 mg | ORAL_TABLET | Freq: Every day | ORAL | Status: DC
  Administered 2022-01-16: 10 mg via ORAL
  Filled 2022-01-16: qty 1

## 2022-01-16 NOTE — ED Notes (Signed)
Advised nurse that patient has ready bed 

## 2022-01-16 NOTE — Assessment & Plan Note (Signed)
Blood pressure within goal. -Continuing home amlodipine -Hold home benazepril and Lasix for AKI

## 2022-01-16 NOTE — ED Notes (Signed)
Patient moved over to hospital bed at this time

## 2022-01-16 NOTE — Hospital Course (Addendum)
Taken from H&P.  Keith Obrien is a 71 y.o. male with medical history significant of complete blindness, diabetes, constipation, hyperlipidemia, essential hypertension who was brought in from [facility] independent living facility with altered mental status, fever and hyperglycemia.  Patient's initial temperature was apparently 100.7 by EMS but more than 101.2 rectally here.  He is autistic but legally blind.  Patient apparently had urinary incontinence all day.  ED course.  Febrile at 101.2, CBC with leukocytosis at 16 and absolute neutrophil of 14.4, lactic acid 3.8>>1.2, CMP with BUN of 26, creatinine 1.78, GFR 40, elevated total protein at 8.2.  COVID and influenza PCR negative. Chest x-ray with linear densities in right lower lung field which can represent subsegmental atelectasis. CT abdomen with moderate to large hiatal hernia, multiple bilateral simple renal cysts, a 3 mm nonobstructing right renal calculus and and large prostate gland.  No other significant abnormality.  Patient was started on sepsis protocol.  10/8: Hemodynamically stable, some improvement in leukocytosis, left acidosis resolved.  Procalcitonin elevated at 2.06.   UA was not very suggestive of UTI but no urine cultures ordered, ordered as add-on to complete the sepsis work-up.  Preliminary blood cultures 1/4 bottles with Staphylococcus species and no other specific identification yet, is likely a contaminant.  Creatinine improving but still elevated from his baseline. Concern of left lower extremity cellulitis .  No upper respiratory symptoms. Repeat chest x-ray with no acute abnormality. He was on cefepime, vancomycin and Flagyl-antibiotics de-escalated to ceftriaxone for cellulitis. 10/9: Hemodynamically stable, procalcitonin improving.  Renal functions improving.  Repeat blood cultures ordered.  We will continue with ceftriaxone today and if repeat blood cultures remain negative, he can go back to his facility tomorrow  on doxycycline for cellulitis.  10/10: Patient remained stable.  Repeat blood cultures remain negative in 24 hours. Left lower extremity cellulitis seems improving.  He is being discharged on doxycycline for 5 more days.  He will continue with the rest of his home medications and follow-up with his providers for further management.

## 2022-01-16 NOTE — Assessment & Plan Note (Signed)
Patient met sepsis criteria with fever, leukocytosis, sinus tachycardia and tachypnea.  Severe sepsis criteria with lactic acidosis at 3.8 and AKI.  Also had metabolic encephalopathy on presentation. Received broad-spectrum antibiotics per sepsis protocol. Most likely secondary to left lower extremity cellulitis, no other obvious infection found. 1/4 blood cultures with staphylococcal species, most likely a contaminant. Lactic acidosis resolved and AKI improving. -De-escalate antibiotics to ceftriaxone. -Follow-up cultures -Repeat blood cultures tomorrow.

## 2022-01-16 NOTE — Progress Notes (Signed)
Progress Note   Patient: Keith Obrien SHF:[id number] DOB: May 29, 1950 DOA: 01/15/2022     1 DOS: the patient was seen and examined on 01/16/2022   Brief hospital course: Taken from H&P.  Keith Obrien is a 71 y.o. male with medical history significant of complete blindness, diabetes, constipation, hyperlipidemia, essential hypertension who was brought in from [facility] independent living facility with altered mental status, fever and hyperglycemia.  Patient's initial temperature was apparently 100.7 by EMS but more than 101.2 rectally here.  He is autistic but legally blind.  Patient apparently had urinary incontinence all day.  ED course.  Febrile at 101.2, CBC with leukocytosis at 16 and absolute neutrophil of 14.4, lactic acid 3.8>>1.2, CMP with BUN of 26, creatinine 1.78, GFR 40, elevated total protein at 8.2.  COVID and influenza PCR negative. Chest x-ray with linear densities in right lower lung field which can represent subsegmental atelectasis. CT abdomen with moderate to large hiatal hernia, multiple bilateral simple renal cysts, a 3 mm nonobstructing right renal calculus and and large prostate gland.  No other significant abnormality.  Patient was started on sepsis protocol.  10/8: Hemodynamically stable, some improvement in leukocytosis, left acidosis resolved.  Procalcitonin elevated at 2.06.   UA was not very suggestive of UTI but no urine cultures ordered, ordered as add-on to complete the sepsis work-up.  Preliminary blood cultures 1/4 bottles with Staphylococcus species and no other specific identification yet, is likely a contaminant.  Creatinine improving but still elevated from his baseline. Concern of left lower extremity cellulitis .  No upper respiratory symptoms. Repeat chest x-ray with no acute abnormality. He was on cefepime, vancomycin and Flagyl-antibiotics de-escalated to ceftriaxone for cellulitis.      Assessment and Plan: * Sepsis [facility]) Patient met  sepsis criteria with fever, leukocytosis, sinus tachycardia and tachypnea.  Severe sepsis criteria with lactic acidosis at 3.8 and AKI.  Also had metabolic encephalopathy on presentation. Received broad-spectrum antibiotics per sepsis protocol. Most likely secondary to left lower extremity cellulitis, no other obvious infection found. 1/4 blood cultures with staphylococcal species, most likely a contaminant. Lactic acidosis resolved and AKI improving. -De-escalate antibiotics to ceftriaxone. -Follow-up cultures -Repeat blood cultures tomorrow.  AKI (acute kidney injury) ([place]) Most likely secondary to sepsis.  Patient presented with creatinine of 1.78 with baseline around 1.2.  Baseline GFR above 60.  Creatinine improving, currently at 1.43. -Continue with IV fluid for another day -Hold home Lasix and benazepril -Monitor renal function -Avoid nephrotoxins   Benign essential hypertension Blood pressure within goal. -Continuing home amlodipine -Hold home benazepril and Lasix for AKI  Diabetes mellitus without complication (HCC) CBG within goal.  A1c of 9.9 in August 2023 -Repeat A1c -Continue with SSI  Autism spectrum disorder Legally blind but apparently highly functional autistic patient. Stating yes and okay to all questions. -Continue home Risperdal  Mixed hyperlipidemia -  Blindness Patient is blind at baseline   Subjective: Patient was seen and examined today.  Denies any pain, nausea or vomiting.  Just replying with yes or okay for all questions.  Physical Exam: Vitals:   01/16/22 1030 01/16/22 1100 01/16/22 1130 01/16/22 1138  BP: 113/81 117/86 113/83   Pulse:   84   Resp: (!) 22 (!) 22 18   Temp:    97.9 F (36.6 C)  TempSrc:    Axillary  SpO2:   96%   Weight:      Height:       General.  Obese and blind gentleman,  in no acute distress. Pulmonary.  Lungs clear bilaterally, normal respiratory effort. CV.  Regular rate and rhythm, no JVD, rub or  murmur. Abdomen.  Soft, nontender, nondistended, BS positive. CNS.  Alert  .  No focal neurologic deficit. Extremities.  No edema, no cyanosis, pulses intact and symmetrical.  Left lower extremity erythema and hyperthermia involving calf. Psychiatry.  Judgment and insight appears impaired  Data Reviewed: Prior data reviewed  Family Communication:   Disposition: Status is: Inpatient Remains inpatient appropriate because: Severity of illness   Planned Discharge Destination: Home  Time spent: 50 minutes  This record has been created using Systems analyst. Errors have been sought and corrected,but may not always be located. Such creation errors do not reflect on the standard of care.  Author: Lorella Nimrod, MD 01/16/2022 1:46 PM  For on call review [email or web address].

## 2022-01-16 NOTE — Assessment & Plan Note (Signed)
Most likely secondary to sepsis.  Patient presented with creatinine of 1.78 with baseline around 1.2.  Baseline GFR above 60.  Creatinine improving, currently at 1.43. -Continue with IV fluid for another day -Hold home Lasix and benazepril -Monitor renal function -Avoid nephrotoxins

## 2022-01-16 NOTE — Assessment & Plan Note (Addendum)
Legally blind but apparently highly functional autistic patient. Stating yes and okay to all questions. -Continue home Risperdal

## 2022-01-16 NOTE — ED Notes (Signed)
Pt stated his brief was wet. Pt was checked and he is not wearing a brief. Pt does have a male purewick in place with no leaking that can be visualized.

## 2022-01-16 NOTE — Progress Notes (Signed)
PHARMACIST - PHYSICIAN COMMUNICATION  CONCERNING:  Enoxaparin (Lovenox) for DVT Prophylaxis    RECOMMENDATION: Patient was prescribed enoxaprin 40mg  q24 hours for VTE prophylaxis.   Filed Weights   01/15/22 1812  Weight: 95.1 kg (209 lb 10.5 oz)    Body mass index is 32.84 kg/m.  Estimated Creatinine Clearance: 40.6 mL/min (A) (by C-G formula based on SCr of 1.78 mg/dL (H)).   Based on [employer] patient is candidate for enoxaparin 0.5mg /kg TBW SQ every 24 hours based on BMI being >30.  DESCRIPTION: Pharmacy has adjusted enoxaparin dose per [facility] policy.  Patient is now receiving enoxaparin 0.5 mg/kg every 24 hours   Renda Rolls, PharmD, [facility] 01/16/2022 4:33 AM

## 2022-01-16 NOTE — Assessment & Plan Note (Addendum)
CBG within goal.  A1c of 9.9 in August 2023 -Repeat A1c -Continue with SSI

## 2022-01-16 NOTE — Progress Notes (Signed)
PHARMACY - PHYSICIAN COMMUNICATION CRITICAL VALUE ALERT - BLOOD CULTURE IDENTIFICATION (BCID)  Keith Obrien is an 71 y.o. male who presented to [facility] on 01/15/2022 with a chief complaint of sepsis of unknown etiology  Assessment:  1/4 bottles GPC. BCID detected Staphylococcus species. Suspect this is a contaminant though could be from early left leg cellulitis  Name of physician (or Provider) Contacted: Dr. Reesa Chew  Current antibiotics: Cefepime + vancomycin + metronidazole  Changes to prescribed antibiotics recommended:  Suspect this result reflects contamination and thus would not recommend changing antimicrobial regimen based on this result  Results for orders placed or performed during the hospital encounter of 01/15/22  Blood Culture ID Panel (Reflexed) (Collected: 01/15/2022  6:21 PM)  Result Value Ref Range   Enterococcus faecalis NOT DETECTED NOT DETECTED   Enterococcus Faecium NOT DETECTED NOT DETECTED   Listeria monocytogenes NOT DETECTED NOT DETECTED   Staphylococcus species DETECTED (A) NOT DETECTED   Staphylococcus aureus (BCID) NOT DETECTED NOT DETECTED   Staphylococcus epidermidis NOT DETECTED NOT DETECTED   Staphylococcus lugdunensis NOT DETECTED NOT DETECTED   Streptococcus species NOT DETECTED NOT DETECTED   Streptococcus agalactiae NOT DETECTED NOT DETECTED   Streptococcus pneumoniae NOT DETECTED NOT DETECTED   Streptococcus pyogenes NOT DETECTED NOT DETECTED   A.calcoaceticus-baumannii NOT DETECTED NOT DETECTED   Bacteroides fragilis NOT DETECTED NOT DETECTED   Enterobacterales NOT DETECTED NOT DETECTED   Enterobacter cloacae complex NOT DETECTED NOT DETECTED   Escherichia coli NOT DETECTED NOT DETECTED   Klebsiella aerogenes NOT DETECTED NOT DETECTED   Klebsiella oxytoca NOT DETECTED NOT DETECTED   Klebsiella pneumoniae NOT DETECTED NOT DETECTED   Proteus species NOT DETECTED NOT DETECTED   Salmonella species NOT DETECTED NOT DETECTED   Serratia  marcescens NOT DETECTED NOT DETECTED   Haemophilus influenzae NOT DETECTED NOT DETECTED   Neisseria meningitidis NOT DETECTED NOT DETECTED   Pseudomonas aeruginosa NOT DETECTED NOT DETECTED   Stenotrophomonas maltophilia NOT DETECTED NOT DETECTED   Candida albicans NOT DETECTED NOT DETECTED   Candida auris NOT DETECTED NOT DETECTED   Candida glabrata NOT DETECTED NOT DETECTED   Candida krusei NOT DETECTED NOT DETECTED   Candida parapsilosis NOT DETECTED NOT DETECTED   Candida tropicalis NOT DETECTED NOT DETECTED   Cryptococcus neoformans/gattii NOT DETECTED NOT DETECTED    Benita Gutter 01/16/2022  11:06 AM

## 2022-01-16 NOTE — Assessment & Plan Note (Signed)
Patient is blind at baseline

## 2022-01-17 LAB — URINE CULTURE: Culture: <10000 — AB

## 2022-01-17 LAB — CBC
HCT: 38.5 % — ABNORMAL LOW (ref 39.0–52.0)
Hemoglobin: 12.4 g/dL — ABNORMAL LOW (ref 13.0–17.0)
MCH: 27.7 pg (ref 26.0–34.0)
MCHC: 32.2 g/dL (ref 30.0–36.0)
MCV: 86.1 fL (ref 80.0–100.0)
Platelets: 162 10*3/uL (ref 150–400)
RBC: 4.47 MIL/uL (ref 4.22–5.81)
RDW: 14.6 % (ref 11.5–15.5)
WBC: 8.2 10*3/uL (ref 4.0–10.5)
nRBC: 0 % (ref 0.0–0.2)

## 2022-01-17 LAB — GLUCOSE, CAPILLARY
Glucose-Capillary: 142 mg/dL — ABNORMAL HIGH (ref 70–99)
Glucose-Capillary: 130 mg/dL — ABNORMAL HIGH (ref 70–99)
Glucose-Capillary: 163 mg/dL — ABNORMAL HIGH (ref 70–99)
Glucose-Capillary: 182 mg/dL — ABNORMAL HIGH (ref 70–99)

## 2022-01-17 LAB — BASIC METABOLIC PANEL
Anion gap: 11 (ref 5–15)
BUN: 20 mg/dL (ref 8–23)
CO2: 26 mmol/L (ref 22–32)
Calcium: 8.8 mg/dL — ABNORMAL LOW (ref 8.9–10.3)
Chloride: 100 mmol/L (ref 98–111)
Creatinine, Ser: 1.39 mg/dL — ABNORMAL HIGH (ref 0.61–1.24)
GFR, Estimated: 54 mL/min — ABNORMAL LOW (ref >60)
Glucose, Bld: 144 mg/dL — ABNORMAL HIGH (ref 70–99)
Potassium: 3 mmol/L — ABNORMAL LOW (ref 3.5–5.1)
Sodium: 137 mmol/L (ref 135–145)

## 2022-01-17 LAB — MAGNESIUM: Magnesium: 1.9 mg/dL (ref 1.7–2.4)

## 2022-01-17 LAB — PROCALCITONIN: Procalcitonin: 1.09 ng/mL

## 2022-01-17 MED ORDER — POTASSIUM CHLORIDE CRYS ER 20 MEQ PO TBCR
40.0000 meq | EXTENDED_RELEASE_TABLET | Freq: Once | ORAL | Status: AC
  Administered 2022-01-17: 40 meq via ORAL
  Filled 2022-01-17: qty 2

## 2022-01-17 NOTE — Assessment & Plan Note (Signed)
Patient met sepsis criteria with fever, leukocytosis, sinus tachycardia and tachypnea.  Severe sepsis criteria with lactic acidosis at 3.8 and AKI.  Also had metabolic encephalopathy on presentation. Received broad-spectrum antibiotics per sepsis protocol. Most likely secondary to left lower extremity cellulitis, no other obvious infection found. 1/4 blood cultures with staphylococcal species, most likely a contaminant. Lactic acidosis resolved and AKI improving. -De-escalate antibiotics to ceftriaxone. -Repeat blood cultures

## 2022-01-17 NOTE — Assessment & Plan Note (Signed)
Most likely secondary to sepsis.  Patient presented with creatinine of 1.78 with baseline around 1.2.  Baseline GFR above 60.  Creatinine improving, currently at 1.39. -Hold home Lasix and benazepril -Monitor renal function -Avoid nephrotoxins

## 2022-01-17 NOTE — Assessment & Plan Note (Signed)
Blood pressure within goal. -Continuing home amlodipine -Hold home benazepril and Lasix for AKI 

## 2022-01-17 NOTE — Plan of Care (Signed)

## 2022-01-17 NOTE — Progress Notes (Signed)
Progress Note   Patient: Keith Obrien DOB: Mar 06, 1951 DOA: 01/15/2022     2 DOS: the patient was seen and examined on 01/17/2022   Brief hospital course: Taken from H&P.  Keith Obrien is a 71 y.o. male with medical history significant of complete blindness, diabetes, constipation, hyperlipidemia, essential hypertension who was brought in from [facility] independent living facility with altered mental status, fever and hyperglycemia.  Patient's initial temperature was apparently 100.7 by EMS but more than 101.2 rectally here.  He is autistic but legally blind.  Patient apparently had urinary incontinence all day.  ED course.  Febrile at 101.2, CBC with leukocytosis at 16 and absolute neutrophil of 14.4, lactic acid 3.8>>1.2, CMP with BUN of 26, creatinine 1.78, GFR 40, elevated total protein at 8.2.  COVID and influenza PCR negative. Chest x-ray with linear densities in right lower lung field which can represent subsegmental atelectasis. CT abdomen with moderate to large hiatal hernia, multiple bilateral simple renal cysts, a 3 mm nonobstructing right renal calculus and and large prostate gland.  No other significant abnormality.  Patient was started on sepsis protocol.  10/8: Hemodynamically stable, some improvement in leukocytosis, left acidosis resolved.  Procalcitonin elevated at 2.06.   UA was not very suggestive of UTI but no urine cultures ordered, ordered as add-on to complete the sepsis work-up.  Preliminary blood cultures 1/4 bottles with Staphylococcus species and no other specific identification yet, is likely a contaminant.  Creatinine improving but still elevated from his baseline. Concern of left lower extremity cellulitis .  No upper respiratory symptoms. Repeat chest x-ray with no acute abnormality. He was on cefepime, vancomycin and Flagyl-antibiotics de-escalated to ceftriaxone for cellulitis. 10/9: Hemodynamically stable, procalcitonin improving.  Renal  functions improving.  Repeat blood cultures ordered.  We will continue with ceftriaxone today and if repeat blood cultures remain negative, he can go back to his facility tomorrow on doxycycline for cellulitis.   Assessment and Plan: * Sepsis [facility]) Patient met sepsis criteria with fever, leukocytosis, sinus tachycardia and tachypnea.  Severe sepsis criteria with lactic acidosis at 3.8 and AKI.  Also had metabolic encephalopathy on presentation. Received broad-spectrum antibiotics per sepsis protocol. Most likely secondary to left lower extremity cellulitis, no other obvious infection found. 1/4 blood cultures with staphylococcal species, most likely a contaminant. Lactic acidosis resolved and AKI improving. -De-escalate antibiotics to ceftriaxone. -Repeat blood cultures   AKI (acute kidney injury) ([place]) Most likely secondary to sepsis.  Patient presented with creatinine of 1.78 with baseline around 1.2.  Baseline GFR above 60.  Creatinine improving, currently at 1.39. -Hold home Lasix and benazepril -Monitor renal function -Avoid nephrotoxins   Benign essential hypertension Blood pressure within goal. -Continuing home amlodipine -Hold home benazepril and Lasix for AKI  Diabetes mellitus without complication (HCC) CBG within goal.  A1c of 9.9 in August 2023 -Repeat A1c-8.3 -Continue with SSI  Autism spectrum disorder Legally blind but apparently highly functional autistic patient. Stating yes and okay to all questions. -Continue home Risperdal  Mixed hyperlipidemia -  Blindness Patient is blind at baseline   Subjective: Patient was sitting comfortably in chair when seen today.  No complaints.  He wants to go back to his facility.  Physical Exam: Vitals:   01/16/22 1530 01/16/22 1601 01/16/22 2015 01/17/22 0817  BP: 116/80 118/87 121/85 111/76  Pulse: 80 78 83 86  Resp: _0 Temp:  97.9 F (36.6 C) 98.5 F (36.9 C) 98.9 F (37.2 C)  TempSrc:  Oral    SpO2: 98% 100% (!) 88% 98%  Weight:      Height:       General.  Legally blind, obese gentleman, in no acute distress. Pulmonary.  Lungs clear bilaterally, normal respiratory effort. CV.  Regular rate and rhythm, no JVD, rub or murmur. Abdomen.  Soft, nontender, nondistended, BS positive. CNS.  Alert and oriented .  No focal neurologic deficit. Extremities.  No edema, no cyanosis, pulses intact and symmetrical. LLE erythema seems improving Psychiatry.  Patient is autistic  Data Reviewed: Prior data reviewed  Family Communication:   Disposition: Status is: Inpatient Remains inpatient appropriate because: Severity of illness   Planned Discharge Destination: Home  Time spent: 45 minutes  This record has been created using Systems analyst. Errors have been sought and corrected,but may not always be located. Such creation errors do not reflect on the standard of care.  Author: Lorella Nimrod, MD 01/17/2022 4:00 PM  For on call review [email or web address].

## 2022-01-17 NOTE — Assessment & Plan Note (Signed)
CBG within goal.  A1c of 9.9 in August 2023 -Repeat A1c-8.3 -Continue with SSI

## 2022-01-18 LAB — GLUCOSE, CAPILLARY: Glucose-Capillary: 156 mg/dL — ABNORMAL HIGH (ref 70–99)

## 2022-01-18 LAB — PROCALCITONIN: Procalcitonin: 0.67 ng/mL

## 2022-01-18 LAB — CULTURE, BLOOD (ROUTINE X 2)

## 2022-01-18 LAB — BASIC METABOLIC PANEL
Anion gap: 11 (ref 5–15)
BUN: 19 mg/dL (ref 8–23)
CO2: 25 mmol/L (ref 22–32)
Calcium: 8.7 mg/dL — ABNORMAL LOW (ref 8.9–10.3)
Chloride: 104 mmol/L (ref 98–111)
Creatinine, Ser: 1.11 mg/dL (ref 0.61–1.24)
GFR, Estimated: >60 mL/min (ref >60)
Glucose, Bld: 132 mg/dL — ABNORMAL HIGH (ref 70–99)
Potassium: 3.4 mmol/L — ABNORMAL LOW (ref 3.5–5.1)
Sodium: 140 mmol/L (ref 135–145)

## 2022-01-18 MED ORDER — DOXYCYCLINE HYCLATE 100 MG PO TABS: 100.0000 mg | ORAL_TABLET | Freq: Two times a day (BID) | ORAL | 0 refills | Status: AC

## 2022-01-18 NOTE — Care Management Important Message (Signed)
Important Message  Patient Details  Name: Keith Obrien MRN: [id number] Date of Birth: 06/13/1950   Medicare Important Message Given:  N/A - LOS <3 / Initial given by admissions     Juliann Pulse A Kanon Novosel 01/18/2022, 9:53 AM

## 2022-01-18 NOTE — Progress Notes (Signed)
Patient niece Ms.Lia Hopping was given discharge information, she acknowledge understanding and states she will comply. Patient was taken to facility by car.

## 2022-01-18 NOTE — Plan of Care (Signed)
  Problem: Coping: Goal: Ability to adjust to condition or change in health will improve Outcome: Progressing   Problem: Fluid Volume: Goal: Ability to maintain a balanced intake and output will improve Outcome: Progressing   Problem: Health Behavior/Discharge Planning: Goal: Ability to identify and utilize available resources and services will improve Outcome: Progressing   Problem: Nutritional: Goal: Maintenance of adequate nutrition will improve Outcome: Progressing   Problem: Skin Integrity: Goal: Risk for impaired skin integrity will decrease Outcome: Progressing   Problem: Clinical Measurements: Goal: Ability to maintain clinical measurements within normal limits will improve Outcome: Progressing Goal: Cardiovascular complication will be avoided Outcome: Progressing

## 2022-01-18 NOTE — Discharge Summary (Addendum)
Physician Discharge Summary   Patient: Keith Obrien MRN: [id number] DOB: [birth year]  Admit date:     01/15/2022  Discharge date: 01/18/22  Discharge Physician: Lorella Nimrod   PCP: Marinda Elk, MD   Recommendations at discharge:  Patient is being discharged on 5 days of doxycycline for left lower extremity cellulitis, please ensure the completion of course. Follow-up with primary care provider within a week  Discharge Diagnoses: Principal Problem:   Sepsis ([place]) Active Problems:   AKI (acute kidney injury) ([place])   Benign essential hypertension   Diabetes mellitus without complication ([place])   Autism spectrum disorder   Blindness   Hospital Course: Taken from H&P.  Keith Obrien is a 71 y.o. male with medical history significant of complete blindness, diabetes, constipation, hyperlipidemia, essential hypertension who was brought in from [facility] independent living facility with altered mental status, fever and hyperglycemia.  Patient's initial temperature was apparently 100.7 by EMS but more than 101.2 rectally here.  He is autistic but legally blind.  Patient apparently had urinary incontinence all day.  ED course.  Febrile at 101.2, CBC with leukocytosis at 16 and absolute neutrophil of 14.4, lactic acid 3.8>>1.2, CMP with BUN of 26, creatinine 1.78, GFR 40, elevated total protein at 8.2.  COVID and influenza PCR negative. Chest x-ray with linear densities in right lower lung field which can represent subsegmental atelectasis. CT abdomen with moderate to large hiatal hernia, multiple bilateral simple renal cysts, a 3 mm nonobstructing right renal calculus and and large prostate gland.  No other significant abnormality.  Patient was started on sepsis protocol.  10/8: Hemodynamically stable, some improvement in leukocytosis, left acidosis resolved.  Procalcitonin elevated at 2.06.   UA was not very suggestive of UTI but no urine cultures ordered, ordered as add-on to  complete the sepsis work-up.  Preliminary blood cultures 1/4 bottles with Staphylococcus species and no other specific identification yet, is likely a contaminant.  Creatinine improving but still elevated from his baseline. Concern of left lower extremity cellulitis .  No upper respiratory symptoms. Repeat chest x-ray with no acute abnormality. He was on cefepime, vancomycin and Flagyl-antibiotics de-escalated to ceftriaxone for cellulitis. 10/9: Hemodynamically stable, procalcitonin improving.  Renal functions improving.  Repeat blood cultures ordered.  We will continue with ceftriaxone today and if repeat blood cultures remain negative, he can go back to his facility tomorrow on doxycycline for cellulitis.  10/10: Patient remained stable.  Repeat blood cultures remain negative in 24 hours. Left lower extremity cellulitis seems improving.  He is being discharged on doxycycline for 5 more days.  He will continue with the rest of his home medications and follow-up with his providers for further management.     Assessment and Plan: * Sepsis [facility]) Patient met sepsis criteria with fever, leukocytosis, sinus tachycardia and tachypnea.  Severe sepsis criteria with lactic acidosis at 3.8 and AKI.  Also had metabolic encephalopathy on presentation. Received broad-spectrum antibiotics per sepsis protocol. Most likely secondary to left lower extremity cellulitis, no other obvious infection found. 1/4 blood cultures with staphylococcal species, most likely a contaminant. Lactic acidosis resolved and AKI improving. -De-escalate antibiotics to ceftriaxone. -Repeat blood cultures   AKI (acute kidney injury) ([place]) Most likely secondary to sepsis.  Patient presented with creatinine of 1.78 with baseline around 1.2.  Baseline GFR above 60.  Creatinine improving, currently at 1.39. -Hold home Lasix and benazepril -Monitor renal function -Avoid nephrotoxins   Benign essential hypertension Blood pressure  within goal. -Continuing home amlodipine -  Hold home benazepril and Lasix for AKI  Diabetes mellitus without complication (HCC) CBG within goal.  A1c of 9.9 in August 2023 -Repeat A1c-8.3 -Continue with SSI  Autism spectrum disorder Legally blind but apparently highly functional autistic patient. Stating yes and okay to all questions. -Continue home Risperdal  Mixed hyperlipidemia -  Blindness Patient is blind at baseline   Consultants: None Procedures performed: None Disposition: Assisted living facility Diet recommendation:  Discharge Diet Orders (From admission, onward)     Start     Ordered   01/18/22 0000  Diet - low sodium heart healthy        01/18/22 1011           Cardiac and Carb modified diet DISCHARGE MEDICATION: Allergies as of 01/18/2022   No Known Allergies      Medication List     STOP taking these medications    triamterene-hydrochlorothiazide 37.5-25 MG tablet Commonly known as: MAXZIDE-25       TAKE these medications    benazepril 10 MG tablet Commonly known as: LOTENSIN Take 10 mg by mouth daily.   doxycycline 100 MG tablet Commonly known as: VIBRA-TABS Take 1 tablet (100 mg total) by mouth 2 (two) times daily for 5 days.   furosemide 20 MG tablet Commonly known as: LASIX Take 20 mg by mouth daily.   metFORMIN 1000 MG tablet Commonly known as: GLUCOPHAGE Take 1,000 mg by mouth daily.   Norvasc 5 MG tablet Generic drug: amLODipine Take 5 mg by mouth daily.   omeprazole 40 MG capsule Commonly known as: PRILOSEC Take 40 mg by mouth daily.   potassium chloride 10 MEQ tablet Commonly known as: KLOR-CON Take 10 mEq by mouth daily.   risperiDONE 0.5 MG tablet Commonly known as: RISPERDAL Take 0.5 mg by mouth at bedtime.   silver sulfADIAZINE 1 % cream Commonly known as: SILVADENE Apply 1 Application topically daily as needed.        Follow-up Information     Marinda Elk, MD. Schedule an appointment  as soon as possible for a visit.   Specialty: Physician Assistant Contact information: [place] [address] [phone]                Discharge Exam: Danley Danker Weights   01/15/22 1812  Weight: 95.1 kg   General.  Autistic and legally blind gentleman, in no acute distress. Pulmonary.  Lungs clear bilaterally, normal respiratory effort. CV.  Regular rate and rhythm, no JVD, rub or murmur. Abdomen.  Soft, nontender, nondistended, BS positive. CNS.  Alert and oriented .  No focal neurologic deficit. Extremities.  No edema, no cyanosis, pulses intact and symmetrical. Psychiatry.  Patient is autistic  Condition at discharge: stable  The results of significant diagnostics from this hospitalization (including imaging, microbiology, ancillary and laboratory) are listed below for reference.   Imaging Studies: DG Chest 2 View  Result Date: 01/16/2022 CLINICAL DATA:  Confusion and high blood sugar EXAM: CHEST - 2 VIEW COMPARISON:  Yesterday FINDINGS: Low volume chest which accentuates heart size and mediastinal width. A Hiatal hernia is known. There is no edema, consolidation, effusion, or pneumothorax. Artifact from EKG leads. Gaseous distension of colon in the upper abdomen. IMPRESSION: Low volume chest without focal abnormality or change from yesterday. Electronically Signed   By: Jorje Guild M.D.   On: 01/16/2022 08:41   CT ABDOMEN PELVIS W CONTRAST  Result Date: 01/15/2022 CLINICAL DATA:  Hyperglycemia with altered mental status  and confusion. EXAM: CT ABDOMEN AND PELVIS WITH CONTRAST TECHNIQUE: Multidetector CT imaging of the abdomen and pelvis was performed using the standard protocol following bolus administration of intravenous contrast. RADIATION DOSE REDUCTION: This exam was performed according to the departmental dose-optimization program which includes automated exposure control, adjustment of the mA and/or kV according to patient  size and/or use of iterative reconstruction technique. CONTRAST:  44m OMNIPAQUE IOHEXOL 300 MG/ML  SOLN COMPARISON:  None Available. FINDINGS: Lower chest: No acute abnormality. Hepatobiliary: No focal liver abnormality is seen. No gallstones, gallbladder wall thickening, or biliary dilatation. Pancreas: 8 mm, 13 mm and 12 mm cystic appearing areas are seen within the pancreatic head (axial CT images 20 through 28, CT series 2). No pancreatic ductal dilatation or surrounding inflammatory changes. Spleen: Normal in size without focal abnormality. Adrenals/Urinary Tract: Adrenal glands are unremarkable. Kidneys are normal in size, without obstructing renal calculi or hydronephrosis. Multiple bilateral simple renal cysts of various sizes are seen. A 3 mm nonobstructing renal calculus is seen within the upper pole of the right kidney. Bladder is unremarkable. Stomach/Bowel: There is a moderate to large hiatal hernia. Appendix appears normal. No evidence of bowel wall thickening, distention, or inflammatory changes. Vascular/Lymphatic: No significant vascular findings are present. No enlarged abdominal or pelvic lymph nodes. Reproductive: The prostate gland is moderately enlarged and heterogeneous in appearance. Other: No abdominal wall hernia or abnormality. No abdominopelvic ascites. Musculoskeletal: Marked severity multilevel degenerative changes seen throughout the lumbar spine. IMPRESSION: 1. Moderate to large hiatal hernia. 2. Multiple bilateral simple renal cysts. No follow-up imaging is recommended. This recommendation follows ACR consensus guidelines: Management of the Incidental Renal Mass on CT: A White Paper of the ACR Incidental Findings Committee. J Am Coll Radiol 2018;15:264-273. 3. 3 mm nonobstructing right renal calculus. 4. Enlarged and heterogeneous prostate gland. Correlation with PSA values is recommended. 5. Marked severity multilevel degenerative changes throughout the lumbar spine. Electronically  Signed   By: TVirgina NorfolkM.D.   On: 01/15/2022 21:02   DG Chest Port 1 View  Result Date: 01/15/2022 CLINICAL DATA:  Possible sepsis, altered mental status, fever EXAM: PORTABLE CHEST 1 VIEW COMPARISON:  Images of previous study done on 11/17/2021 are not available for review. Report for the previous study was reviewed. FINDINGS: Transverse diameter of heart is slightly increased. Thoracic aorta is tortuous and ectatic. Linear densities are seen in right lower lung field. There are no signs of pulmonary edema or focal pulmonary consolidation. There is no pleural effusion or pneumothorax. IMPRESSION: Linear densities in right lower lung fields may suggest subsegmental atelectasis. There are no signs of pulmonary edema or focal pulmonary consolidation. Electronically Signed   By: PElmer PickerM.D.   On: 01/15/2022 19:47    Microbiology: Results for orders placed or performed during the hospital encounter of 01/15/22  Culture, blood (Routine x 2)     Status: Abnormal   Collection Time: 01/15/22  6:21 PM   Specimen: BLOOD  Result Value Ref Range Status   Specimen Description   Final    BLOOD BLOOD RIGHT FOREARM [facility]formed at [facility] [address] [place] [address]   Special Requests   Final    BOTTLES DRAWN AEROBIC AND ANAEROBIC Blood Culture results may not be optimal due to an inadequate volume of blood received in culture bottles [facility] at [facility] [address] [place] [address]   Culture  Setup Time   Final    ANAEROBIC  BOTTLE ONLY GRAM POSITIVE COCCI CRITICAL RESULT CALLED TO, READ BACK BY AND VERIFIED WITH: ALEX CHAPPELL _0  01/16/22 MJU    Culture (A)  Final    STAPHYLOCOCCUS HAEMOLYTICUS THE SIGNIFICANCE OF ISOLATING THIS ORGANISM FROM A SINGLE SET OF BLOOD CULTURES WHEN MULTIPLE SETS ARE DRAWN IS UNCERTAIN. PLEASE NOTIFY THE MICROBIOLOGY DEPARTMENT WITHIN ONE WEEK IF SPECIATION AND SENSITIVITIES ARE  REQUIRED. Performed at [facility], [place] [address] [place], [place] [address]    Report Status 01/18/2022 FINAL  Final  Urine Culture     Status: Abnormal   Collection Time: 01/15/22  6:21 PM   Specimen: Urine, Clean Catch  Result Value Ref Range Status   Specimen Description   Final    URINE, CLEAN CATCH Performed at [facility], [address], [place], [place] [address]    Special Requests   Final    NONE Performed at [facility], [address], [place], [place] [address]    Culture (A)  Final    <10,000 COLONIES/mL INSIGNIFICANT GROWTH Performed at [place] [address], [place], [place] [address]    Report Status 01/17/2022 FINAL  Final  Blood Culture ID Panel (Reflexed)     Status: Abnormal   Collection Time: 01/15/22  6:21 PM  Result Value Ref Range Status   Enterococcus faecalis NOT DETECTED NOT DETECTED Final   Enterococcus Faecium NOT DETECTED NOT DETECTED Final   Listeria monocytogenes NOT DETECTED NOT DETECTED Final   Staphylococcus species DETECTED (A) NOT DETECTED Final    Comment: CRITICAL RESULT CALLED TO, READ BACK BY AND VERIFIED WITH: ALEX CHAPPELL _1  01/16/22 MJU    Staphylococcus aureus (BCID) NOT DETECTED NOT DETECTED Final   Staphylococcus epidermidis NOT DETECTED NOT DETECTED Final   Staphylococcus lugdunensis NOT DETECTED NOT DETECTED Final   Streptococcus species NOT DETECTED NOT DETECTED Final   Streptococcus agalactiae NOT DETECTED NOT DETECTED Final   Streptococcus pneumoniae NOT DETECTED NOT DETECTED Final   Streptococcus pyogenes NOT DETECTED NOT DETECTED Final   A.calcoaceticus-baumannii NOT DETECTED NOT DETECTED Final   Bacteroides fragilis NOT DETECTED NOT DETECTED Final   Enterobacterales NOT DETECTED NOT DETECTED Final   Enterobacter cloacae complex NOT DETECTED NOT DETECTED Final   Escherichia coli NOT DETECTED NOT DETECTED Final   Klebsiella aerogenes NOT DETECTED NOT DETECTED Final    Klebsiella oxytoca NOT DETECTED NOT DETECTED Final   Klebsiella pneumoniae NOT DETECTED NOT DETECTED Final   Proteus species NOT DETECTED NOT DETECTED Final   Salmonella species NOT DETECTED NOT DETECTED Final   Serratia marcescens NOT DETECTED NOT DETECTED Final   Haemophilus influenzae NOT DETECTED NOT DETECTED Final   Neisseria meningitidis NOT DETECTED NOT DETECTED Final   Pseudomonas aeruginosa NOT DETECTED NOT DETECTED Final   Stenotrophomonas maltophilia NOT DETECTED NOT DETECTED Final   Candida albicans NOT DETECTED NOT DETECTED Final   Candida auris NOT DETECTED NOT DETECTED Final   Candida glabrata NOT DETECTED NOT DETECTED Final   Candida krusei NOT DETECTED NOT DETECTED Final   Candida parapsilosis NOT DETECTED NOT DETECTED Final   Candida tropicalis NOT DETECTED NOT DETECTED Final   Cryptococcus neoformans/gattii NOT DETECTED NOT DETECTED Final    Comment: Performed at [facility], [address] [place], [place] [address]  Culture, blood (Routine x 2)     Status: None (Preliminary result)   Collection Time: 01/15/22  8:21 PM   Specimen: BLOOD  Result Value Ref Range Status   Specimen Description BLOOD  LEFT ANTECUBITAL  Final   Special Requests   Final    BOTTLES DRAWN AEROBIC AND ANAEROBIC Blood Culture adequate volume   Culture   Final    NO GROWTH 3 DAYS Performed at [facility], [address], [place], [place] [address]    Report Status PENDING  Incomplete  SARS Coronavirus 2 by RT PCR (hospital order, performed in [facility] hospital lab) *cepheid single result test* Anterior Nasal Swab     Status: None   Collection Time: 01/15/22  9:00 PM   Specimen: Anterior Nasal Swab  Result Value Ref Range Status   SARS Coronavirus 2 by RT PCR NEGATIVE NEGATIVE Final    Comment: (NOTE) SARS-CoV-2 target nucleic acids are NOT DETECTED.  The SARS-CoV-2 RNA is generally detectable in upper and lower respiratory specimens during the acute phase of  infection. The lowest concentration of SARS-CoV-2 viral copies this assay can detect is 250 copies / mL. A negative result does not preclude SARS-CoV-2 infection and should not be used as the sole basis for treatment or other patient management decisions.  A negative result may occur with improper specimen collection / handling, submission of specimen other than nasopharyngeal swab, presence of viral mutation(s) within the areas targeted by this assay, and inadequate number of viral copies (<250 copies / mL). A negative result must be combined with clinical observations, patient history, and epidemiological information.  Fact Sheet for Patients:   https://www.patel.info/  Fact Sheet for Healthcare Providers: https://hall.com/  This test is not yet approved or  cleared by the [place] FDA and has been authorized for detection and/or diagnosis of SARS-CoV-2 by FDA under an Emergency Use Authorization (EUA).  This EUA will remain in effect (meaning this test can be used) for the duration of the COVID-19 declaration under Section 564(b)(1) of the Act, 21 U.S.C. section 360bbb-3(b)(1), unless the authorization is terminated or revoked sooner.  Performed at [facility], [address] [place], [place] [address]   Culture, blood (Routine X 2) w Reflex to ID Panel     Status: None (Preliminary result)   Collection Time: 01/17/22  9:33 AM   Specimen: BLOOD RIGHT ARM  Result Value Ref Range Status   Specimen Description BLOOD RIGHT ARM  Final   Special Requests   Final    BOTTLES DRAWN AEROBIC AND ANAEROBIC Blood Culture adequate volume   Culture   Final    NO GROWTH < 24 HOURS Performed at [facility], [address] [place], [place] [address]    Report Status PENDING  Incomplete  Culture, blood (Routine X 2) w Reflex to ID Panel     Status: None (Preliminary result)   Collection Time: 01/17/22  9:33 AM    Specimen: BLOOD LEFT ARM  Result Value Ref Range Status   Specimen Description BLOOD LEFT ARM  Final   Special Requests   Final    BOTTLES DRAWN AEROBIC AND ANAEROBIC Blood Culture adequate volume   Culture   Final    NO GROWTH < 24 HOURS Performed at [facility], [address] [place], [place] [address]    Report Status PENDING  Incomplete    Labs: CBC: Recent Labs  Lab 01/15/22 1816 01/16/22 0454 01/17/22 0423  WBC 16.0* 11.1* 8.2  NEUTROABS 14.4*  --   --   HGB 14.0 12.8* 12.4*  HCT 45.3 40.0 38.5*  MCV 89.3 87.7 86.1  PLT 209 169 371   Basic Metabolic Panel: Recent Labs  Lab 01/15/22 1816 01/16/22 0454 01/17/22 0423 01/17/22 0933 01/18/22 0325  NA 139  --  137  --  140  K 3.7  --  3.0*  --  3.4*  CL 104  --  100  --  104  CO2 22  --  26  --  25  GLUCOSE 166*  --  144*  --  132*  BUN 26*  --  20  --  19  CREATININE 1.78* 1.43* 1.39*  --  1.11  CALCIUM 9.6  --  8.8*  --  8.7*  MG  --   --   --  1.9  --    Liver Function Tests: Recent Labs  Lab 01/15/22 1816  AST 35  ALT 20  ALKPHOS 75  BILITOT 0.8  PROT 8.2*  ALBUMIN 4.4   CBG: Recent Labs  Lab 01/17/22 0727 01/17/22 1124 01/17/22 1739 01/17/22 2144 01/18/22 0811  GLUCAP 142* 130* 163* 182* 156*    Discharge time spent: greater than 30 minutes.  This record has been created using Systems analyst. Errors have been sought and corrected,but may not always be located. Such creation errors do not reflect on the standard of care.   Signed: Lorella Nimrod, MD [facility] 01/18/2022

## 2022-01-20 LAB — CULTURE, BLOOD (ROUTINE X 2)
Culture: NO GROWTH
Special Requests: ADEQUATE

## 2022-01-22 LAB — CULTURE, BLOOD (ROUTINE X 2)
Culture: NO GROWTH
Culture: NO GROWTH
Special Requests: ADEQUATE
Special Requests: ADEQUATE

## 2024-02-09 ENCOUNTER — Other Ambulatory Visit: Payer: Self-pay

## 2024-02-09 MED ORDER — FLUZONE HIGH-DOSE 0.5 ML IM SUSY
0.5000 mL | PREFILLED_SYRINGE | Freq: Once | INTRAMUSCULAR | 0 refills | Status: AC
  Filled 2024-02-09: qty 0.5, 1d supply, fill #0

## 2024-02-09 MED ORDER — COMIRNATY 30 MCG/0.3ML IM SUSY
0.3000 mL | PREFILLED_SYRINGE | Freq: Once | INTRAMUSCULAR | 0 refills | Status: AC
  Filled 2024-02-09: qty 0.3, 1d supply, fill #0

## 2024-03-01 ENCOUNTER — Encounter: Payer: Self-pay | Admitting: Psychology

## 2024-04-25 ENCOUNTER — Encounter: Attending: Psychology | Admitting: Psychology

## 2024-04-25 NOTE — Progress Notes (Incomplete)
 "  NEUROPSYCHOLOGICAL EVALUATION [facility]  Physical Medicine and Rehabilitation     Patient: Keith Obrien  DOB: Jan 03, 1951  Age: 74 y.o. Sex: male  Race/Ethnicity: Black or African American *** Years of Ed.: ***  Collateral Source: ***  Referring Provider: Marikay Eva POUR, PA Provider / Neuropsychologist: Evalene DOROTHA Riff, PsyD  Date of Service: @DOS @ Start: *** End: *** Location:  [place]. [facility] Physical Medicine & Rehabilitation Department [address], [place], [place] [address] Billing Code/Service: [phone] (1 Unit), [phone] (1 Unit) 1 hour and 15 minutes spent in face-to-face clinical interview and remaining 45 minutes was spent in record review, documentation, and testing protocol construction.   Individuals Present: The patient was seen by the provider, in-person, in the provider's outpatient office. The patient was ***  PATIENT CONSENT AND CONFIDENTIALITY The patient's understanding of the reason for referral was intact. Discussed limits of confidentiality including, but not limited to, posting of final evaluation report in the patient's electronic medical record for both the patient and for the referring provider and appropriate medical professionals. Patient was given the opportunity to have their questions answered. The neuropsychological evaluation process was discussed with the patient and they consented to proceed with the evaluation.  Consent for Evaluation and Treatment: Signed: Yes Explanation of Privacy Policies: Signed: Yes Discussion of Confidentiality Limits: Yes  REASON FOR REFERRAL & RECORD REVIEW The patient was referred for neuropsychological evaluation by ***   Upon interview, the patient ***  Why they need a psychological done?   Keith Obrien houses individuals with disabilities  Just trying to find  Cousin and POA, Keith. Obrien  Situation is that he lives at [place] in  [place], indpeendent living, has a studio apartment. He can't afford it anymore, financially.  And so we are trying to find places, and everytime, they say he needs a psychological. He is high functioning.   He lives at [facility]rien comfortably. They take his sugar twice a day.   Never been on disability.   [place] [employer] of of health - disability - Obrien Breed  [phone]  Trying to find agencies to help place - they dont help place him.   Never drew disability.    Want a psychological evaluation to know what his functioning is.   DOB [birth year], 74,  2026, Thursday, January 15,  Live currently [place], [place] [phone]    Keith Obrien -  HISTORY OF PRESENTING CONCERNS:  Cognitive Symptom Onset & Course:   BACKGROUND & PRESENTING CONCERNS:  Grew up [place] - Raised by mother and father.  Born Blind, legally, and legally deaf, and with ASD.  He could see and he could hear to some degree.   Learning difficulties in school growing up?  He went to a school. Until age 40  Worked in a school until 1981, then [employer], working putting nature conservation officer in boxes. 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  What did you do? I did different jobs. 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 Finished that up in 1997. Was in [place] at that time.   He lived with his mother.   She came down with him. Lived with her until she passed. Moved from  [place] to [place] then she passed in 2021.      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   Learned to read large print.   Never read brail.     Special classes.  Did no go to high school went to public school - was in a special education - [employer]     Developmental & Academic  History:   Vocational History:   ADHD Symptomatology:  -Endorsements were based on patient self report during interview. Diagnostic clinical interview utilized questions and examples from DIVA-5, and overall structure which was combined with broader diagnostic clinical interviewing for symptom validation and differential.   -Symptoms indicated as currently present (adult): Present at least 6 months but also appear chronic/trait-like. Symptoms negatively impact functioning. Symptoms are not better explained by another mental disorder. -Symptoms indicated as present during childhood: Present by 74 years of age, are inconsistent with developmental level, negatively impact functioning, and are not better explained by another mental disorder.  Attention-Deficit Symptoms: Often difficulties with.. Current Childhood  A1a Attention to detail/careless mistakes:  *** ***  A1b Sustained attention:  *** ***  A1c Attending when addressed directly:  *** ***  A1d Follow through on instructions / tasks:   *** ***  A1e Organizational difficulty:  *** ***  A52f Aversion / avoidance of sustained mental effort:  *** ***  A1g Losing things necessary for tasks/activities:  *** ***  A1h Easily distracted by extraneous stimuli:  *** ***  A1i Forgetful in daily activities:  *** ***      Hyperactivity/Impulsivity Symptoms: Often difficulties with    A2a Fidgeting/squirming in seat: *** ***  A2b Remaining seated / needing to move around: *** ***  A2c Restlessness/runs or climbs when inappropriate:  *** ***  A2d Engaging in leisure activities quietly: ** *** ***  A2e Excessive energy:  *** ***  A25f Talking excessively:  *** ***  A2g Blurting out answers / interrupting others:  *** ***  A2h Waiting turn:  *** ***  A2i Interruption or socially intrusive: *** ***   Possible secondary factors impacting current Sx: Complicating factors regarding childhood Sx:   Functional Impact:    Current Cognitive Complaints:   Memory:  Patient: Not noticing problems.  Collateral: ***   Processing Speed:  Patient: *** Collateral: ***   Attention & Concentration: Patient: *** Collateral: ***   Language:  Patient: *** Collateral: ***   Visual-Spatial:  Patient: *** Collateral: ***   Executive Functioning:  Patient: *** Collateral: ***    Counsins helping with make appointments  Medicines - He fills the pill box, we go and pick boxes, and takes medications on own though.   Always help with managing the medical appointments. Always had help with that... he can't do on own, cognitively, would need supervision though.   Not sure   Nmot noticed a change in functioning outside of issues with walking.   Nurses helps with the medications.   Financiaes - always had help and that continues.  Preparing meals, they prepare things, rent includes meals. Breakast lunch and dinner.   Little concrete in thinking. Really can't function without all the help that he has.   Part of that is   Thinks would have toruble even if hearing and vision was good.  Does   Last five years, behavior had been under control.   Risperidone , needed to remain calm  Anything that wasn't part of the, difficulty with things changing would be.   Motor/Sensory Complaints:  Sensory changes: *** Balance/coordination difficulties: He doesn't use a walker, doesn't use a cane even though he should use his can/  Frequent instances of dizziness/vertigo: *** Other motor difficulties: ***  Emotional and Behavioral Functioning:  Depression Symptomatology: NO depressed.  Anxiety Symptomatology:  No wory.   Other Symptomatology: No SMI stuff.   No psychology or psychiatrist currently...  Not known psychiatry in the past.   No psych ward admissions.     Sleep: Nine hours of sleep per night. 7 oclock going to bed, 5 am.  Appetite: Good.  Caffeine: 1 cup in mroning  Alcohol Use: No Alch.  Tobacco Use: None Recreational  Substance Use: None   Academic/Vocational History: ***  Psychosocial: Marital Status: Never married.    Children/Grandchildren: No children   Living Situation: ***   Daily Activities/Hobbies: Exercise, walk around, listen to news, Maybe meandering a litle bit. More to the left.     Level of Functional Independence: The patient is *** with basic activities of daily living.  Finances: ***   Shopping / Meal Preparation: ***   Household Maintenance / Chores: Firefighter / Future Obligations: ***   Medication Management: ***     Driving: ***    Medical History/Record Review: Per records and patient report, History of traumatic brain injury/concussion: No   History of stroke: No History of heart attack: No History of cancer/chemotherapy: No   History of seizure activity: No   History of known exposure to toxins: ***   Symptoms of chronic pain: No   Experience of frequent headaches/migraines: NO    Imaging/Lab Results: *** Past Medical History:  Diagnosis Date   Blindness    Constipation    Diabetes mellitus without complication (HCC)    Hypercholesteremia    Hypertension    Obesity    Peptic ulcer disease    Patient Active Problem List   Diagnosis Date Noted   AKI (acute kidney injury) 01/16/2022   Cellulitis    Elevated lactic acid level    Sepsis (HCC) 01/15/2022   Autism spectrum disorder 01/22/2014   Benign essential hypertension 09/18/2013   Blindness 09/18/2013   Diabetes mellitus without complication (HCC) 09/18/2013   Mixed hyperlipidemia 09/18/2013   Family Neurologic/Medical Hx: *** No family history on file.  Medications: ***     Mental Status/Behavioral Observations: The patient was seen on an outpatient basis in the [facility] PM&R office for the clinical interview *** Sensorium/Arousal: ***   Orientation: ***   Appearance: ***   Behavior: ***   Speech/Language: ***   Motor: ***   Social Comportment: ***   Mood: ***   Affect:  ***   Thought Process/Content: ***   Ability to Participate in Interview: ***   Insight: ***    SUMMARY / CLINICAL IMPRESSIONS ***  DISPOSITION / PLAN The patient has been set up for a formal neuropsychological assessment to objectively assess her cognitive functioning across domains to establish the patient's cognitive profile. This data, in conjunction with information obtained via clinical interview and medical record review, will help clarify likely etiology and guide treatment recommendations. Once data collection and interpretation have been completed, the findings / diagnosis and recommendations will be reviewed and discussed with the patient during a feedback appointment with the neuropsychologist. Based on the collaborative dialogue with the patient during the feedback, recommendations may be adjusted / tailored as needed. A formal report will be produced and provided to the patient and the referring provider.   Diagnosis:  FULL REPORT TO FOLLOW   Evalene DOROTHA Riff, PsyD [facility]&R-Clinical Neuropsychology [address] [place], [place] [address] Main: [phone] Fax: [phone] [place] License # [id number]  This report was generated using voice recognition software. While this document has been carefully reviewed, transcription errors may be present. I apologize in advance for any inconvenience. Please contact me if further clarification is needed.  "

## 2024-05-16 ENCOUNTER — Encounter

## 2024-05-16 NOTE — Progress Notes (Unsigned)
 "  Mental Status/Behavioral Observations (05/16/2024):  Orientation: The patient was oriented to self. He was not oriented to place (type of facility, name of hospital, city). He was oriented to  time with the exception of time of day. Sensory/Arousal: Hearing was adequate for the demands of testing with the assistance of hearing aids and increased speech loudness. Due to significant visual impairment, testing was verbally-based. The patient was alert. He appeared to become somewhat fatigued towards the end of the testing session. Appearance: Dress and hygiene were appropriate for the setting.  Speech/Language: In conversation, the patient's speech was prosodic, fluent, generally well-articulated. The patient displayed no indications of word finding difficulties. No word substitution errors were observed.  Motor: The patient ambulated with the assistance of another person. No tremors were observed.  Social Comportment: Social behavior was appropriate for the setting. Mood/Affect: Mood was largely neutral to positive. Affect was congruent with mood.  Attention/Concentration:  The patient appeared to maintain consistent engagement throughout the testing session, though he required some encouragement to get through the last few tasks. No frank attentional lapses were observed.  Thought Process/Content: The patient's thought process was coherent, linear, goal-directed. There were no indications of psychosis.  Additional Observations: The patient showed some difficulties with understanding task instructions, and required frequent repetition. Some difficulties with frustration tolerance were noted towards the end of testing.  Neuropsychology Note Rexton Greulich completed 100 minutes of neuropsychological testing with technician, Josue Ned, BA, under the supervision of Evalene Riff, PsyD., Clinical Neuropsychologist. The patient did not appear overtly distressed by the testing session, per behavioral  observation or via self-report to the technician. Rest breaks were offered.   Clinical Decision Making: In considering the patient's current level of functioning, level of presumed impairment, nature of symptoms, emotional and behavioral responses during clinical interview, level of literacy, and observed level of motivation/effort, a battery of tests was selected by Dr. Riff during initial consultation on 04/25/2024. This was communicated to the technician. Communication between the neuropsychologist and technician was ongoing throughout the testing session and changes were made as deemed necessary based on patient performance on testing, technician observations and additional pertinent factors such as those listed above.  Tests Administered: Controlled Oral Word Association Test (FAS & Animals) Delis-Kaplan Executive Function System (D-KEFS), select subtests Dementia Rating Scale-2 (DRS-2) Hopkins Verbal Learning Test - Revised (HVLT-R) Montreal Cognitive Assessment [facility]), select subtest Neuropsychological Assessment Battery (NAB), select subtest Wechsler Adult Intelligence Scale-Fourth Edition (WAIS-IV), select subtests Wechsler Memory Scale-Fourth Edition (WMS-IV) , select subtests Wechsler Memory Scale-Third Edition (WMS-III), select subtest Geriatric Depression Scale-Short Form (GDS-SF) Geriatric Anxiety Inventory (GAI)  NEUROPSYCHOLOGICAL TEST RESULTS: Note: This summary of test scores accompanies the interpretive report and should not be interpreted by unqualified individuals or in isolation without reference to the report.   Test scores are relative to age and further adjusted for educational history and demographics as available when appropriate.  Measurement properties of test scores: IQ, Index, and Standard Scores (SS): Mean = 100; Standard Deviation = 15; Scaled Scores (ss): Mean = 10; Standard Deviation = 3; Z scores (Z): Mean = 0; Standard Deviation = 1; T scores (T); Mean = 50;  Standard Deviation = 10    Feedback to Patient: Damyan Corne will return on 05/30/2024 for an interactive feedback session with Dr. Riff at which time his test performances, clinical impressions and treatment recommendations will be reviewed in detail. The patient understands he can contact our office should he require our assistance before this time.  100 minutes spent face-to-face with patient administering standardized tests, *** minutes spent scoring radiographer, therapeutic). [CPT [id number], [id number]]  Full report to follow. "

## 2024-05-23 ENCOUNTER — Encounter: Admitting: Psychology

## 2024-05-30 ENCOUNTER — Encounter: Admitting: Psychology
